# Patient Record
Sex: Male | Born: 1960 | Race: White | Hispanic: No | Marital: Married | Smoking: Former smoker
Health system: Southern US, Community
[De-identification: ages and names within clinical notes are randomized; demographics above are authoritative.]

## PROBLEM LIST (undated history)

## (undated) DIAGNOSIS — J449 Chronic obstructive pulmonary disease, unspecified: Secondary | ICD-10-CM

## (undated) DIAGNOSIS — F419 Anxiety disorder, unspecified: Secondary | ICD-10-CM

## (undated) DIAGNOSIS — I219 Acute myocardial infarction, unspecified: Secondary | ICD-10-CM

## (undated) DIAGNOSIS — I252 Old myocardial infarction: Secondary | ICD-10-CM

## (undated) DIAGNOSIS — C801 Malignant (primary) neoplasm, unspecified: Secondary | ICD-10-CM

## (undated) HISTORY — DX: Anxiety disorder, unspecified: F41.9

## (undated) HISTORY — PX: BACK SURGERY: SHX140

## (undated) HISTORY — DX: Malignant (primary) neoplasm, unspecified: C80.1

---

## 1998-11-20 ENCOUNTER — Emergency Department (HOSPITAL_COMMUNITY): Admission: EM | Admit: 1998-11-20 | Discharge: 1998-11-20 | Payer: Self-pay | Admitting: Emergency Medicine

## 2002-05-08 ENCOUNTER — Emergency Department (HOSPITAL_COMMUNITY): Admission: EM | Admit: 2002-05-08 | Discharge: 2002-05-08 | Payer: Self-pay | Admitting: *Deleted

## 2003-12-10 ENCOUNTER — Inpatient Hospital Stay (HOSPITAL_COMMUNITY): Admission: RE | Admit: 2003-12-10 | Discharge: 2003-12-15 | Payer: Self-pay | Admitting: Psychiatry

## 2003-12-10 ENCOUNTER — Ambulatory Visit: Payer: Self-pay | Admitting: Psychiatry

## 2004-03-03 ENCOUNTER — Inpatient Hospital Stay (HOSPITAL_COMMUNITY): Admission: RE | Admit: 2004-03-03 | Discharge: 2004-03-07 | Payer: Self-pay | Admitting: Psychiatry

## 2004-03-03 ENCOUNTER — Ambulatory Visit: Payer: Self-pay | Admitting: Psychiatry

## 2006-07-22 ENCOUNTER — Emergency Department (HOSPITAL_COMMUNITY): Admission: EM | Admit: 2006-07-22 | Discharge: 2006-07-22 | Payer: Self-pay | Admitting: Emergency Medicine

## 2006-11-03 ENCOUNTER — Emergency Department (HOSPITAL_COMMUNITY): Admission: EM | Admit: 2006-11-03 | Discharge: 2006-11-03 | Payer: Self-pay | Admitting: Emergency Medicine

## 2006-11-19 ENCOUNTER — Ambulatory Visit: Payer: Self-pay | Admitting: Family Medicine

## 2006-11-19 DIAGNOSIS — R519 Headache, unspecified: Secondary | ICD-10-CM | POA: Insufficient documentation

## 2006-11-19 DIAGNOSIS — F1021 Alcohol dependence, in remission: Secondary | ICD-10-CM | POA: Insufficient documentation

## 2006-11-19 DIAGNOSIS — E785 Hyperlipidemia, unspecified: Secondary | ICD-10-CM | POA: Insufficient documentation

## 2006-11-19 DIAGNOSIS — G8929 Other chronic pain: Secondary | ICD-10-CM | POA: Insufficient documentation

## 2006-11-19 DIAGNOSIS — M545 Low back pain, unspecified: Secondary | ICD-10-CM | POA: Insufficient documentation

## 2006-11-19 DIAGNOSIS — F32A Depression, unspecified: Secondary | ICD-10-CM | POA: Insufficient documentation

## 2006-11-19 DIAGNOSIS — R51 Headache: Secondary | ICD-10-CM | POA: Insufficient documentation

## 2006-11-19 DIAGNOSIS — M549 Dorsalgia, unspecified: Secondary | ICD-10-CM | POA: Insufficient documentation

## 2006-11-19 DIAGNOSIS — F329 Major depressive disorder, single episode, unspecified: Secondary | ICD-10-CM | POA: Insufficient documentation

## 2006-11-19 DIAGNOSIS — Z9189 Other specified personal risk factors, not elsewhere classified: Secondary | ICD-10-CM | POA: Insufficient documentation

## 2006-11-19 DIAGNOSIS — I252 Old myocardial infarction: Secondary | ICD-10-CM | POA: Insufficient documentation

## 2006-11-19 DIAGNOSIS — F419 Anxiety disorder, unspecified: Secondary | ICD-10-CM | POA: Insufficient documentation

## 2006-12-31 ENCOUNTER — Telehealth: Payer: Self-pay | Admitting: Family Medicine

## 2007-01-07 ENCOUNTER — Ambulatory Visit: Payer: Self-pay | Admitting: Family Medicine

## 2007-01-28 ENCOUNTER — Encounter: Payer: Self-pay | Admitting: Family Medicine

## 2007-02-15 ENCOUNTER — Ambulatory Visit: Payer: Self-pay | Admitting: Family Medicine

## 2007-02-24 ENCOUNTER — Telehealth: Payer: Self-pay | Admitting: Family Medicine

## 2007-09-30 ENCOUNTER — Ambulatory Visit: Payer: Self-pay | Admitting: Family Medicine

## 2007-10-21 ENCOUNTER — Telehealth: Payer: Self-pay | Admitting: Family Medicine

## 2007-11-01 ENCOUNTER — Telehealth: Payer: Self-pay | Admitting: Family Medicine

## 2007-11-15 ENCOUNTER — Emergency Department (HOSPITAL_COMMUNITY): Admission: EM | Admit: 2007-11-15 | Discharge: 2007-11-15 | Payer: Self-pay | Admitting: Emergency Medicine

## 2007-11-29 ENCOUNTER — Telehealth: Payer: Self-pay | Admitting: Family Medicine

## 2007-12-14 ENCOUNTER — Telehealth: Payer: Self-pay | Admitting: Family Medicine

## 2007-12-22 ENCOUNTER — Emergency Department (HOSPITAL_COMMUNITY): Admission: EM | Admit: 2007-12-22 | Discharge: 2007-12-22 | Payer: Self-pay | Admitting: Emergency Medicine

## 2007-12-26 ENCOUNTER — Emergency Department (HOSPITAL_COMMUNITY): Admission: EM | Admit: 2007-12-26 | Discharge: 2007-12-26 | Payer: Self-pay | Admitting: Emergency Medicine

## 2007-12-27 ENCOUNTER — Telehealth: Payer: Self-pay | Admitting: Family Medicine

## 2007-12-28 ENCOUNTER — Telehealth: Payer: Self-pay | Admitting: Family Medicine

## 2008-01-09 ENCOUNTER — Telehealth: Payer: Self-pay | Admitting: Family Medicine

## 2008-02-01 ENCOUNTER — Ambulatory Visit: Payer: Self-pay | Admitting: Cardiology

## 2008-02-02 ENCOUNTER — Ambulatory Visit: Payer: Self-pay

## 2008-02-02 ENCOUNTER — Encounter: Payer: Self-pay | Admitting: Cardiology

## 2008-02-07 ENCOUNTER — Ambulatory Visit: Payer: Self-pay | Admitting: Cardiology

## 2008-02-23 ENCOUNTER — Telehealth: Payer: Self-pay | Admitting: Family Medicine

## 2008-03-30 ENCOUNTER — Emergency Department (HOSPITAL_COMMUNITY): Admission: EM | Admit: 2008-03-30 | Discharge: 2008-03-30 | Payer: Self-pay | Admitting: Emergency Medicine

## 2008-05-31 ENCOUNTER — Emergency Department (HOSPITAL_COMMUNITY): Admission: EM | Admit: 2008-05-31 | Discharge: 2008-06-01 | Payer: Self-pay | Admitting: Emergency Medicine

## 2008-06-01 ENCOUNTER — Inpatient Hospital Stay (HOSPITAL_COMMUNITY): Admission: EM | Admit: 2008-06-01 | Discharge: 2008-06-13 | Payer: Self-pay | Admitting: Psychiatry

## 2008-06-01 ENCOUNTER — Ambulatory Visit: Payer: Self-pay | Admitting: Psychiatry

## 2008-09-05 ENCOUNTER — Encounter: Payer: Self-pay | Admitting: Family Medicine

## 2008-09-11 ENCOUNTER — Encounter (INDEPENDENT_AMBULATORY_CARE_PROVIDER_SITE_OTHER): Payer: Self-pay | Admitting: *Deleted

## 2008-09-14 ENCOUNTER — Ambulatory Visit: Payer: Self-pay | Admitting: Family Medicine

## 2009-07-01 ENCOUNTER — Emergency Department (HOSPITAL_COMMUNITY): Admission: EM | Admit: 2009-07-01 | Discharge: 2009-07-01 | Payer: Self-pay | Admitting: Emergency Medicine

## 2009-07-05 ENCOUNTER — Emergency Department (HOSPITAL_COMMUNITY): Admission: EM | Admit: 2009-07-05 | Discharge: 2009-07-05 | Payer: Self-pay | Admitting: Emergency Medicine

## 2009-08-25 ENCOUNTER — Emergency Department (HOSPITAL_COMMUNITY): Admission: EM | Admit: 2009-08-25 | Discharge: 2009-08-25 | Payer: Self-pay | Admitting: Emergency Medicine

## 2009-09-07 ENCOUNTER — Emergency Department (HOSPITAL_COMMUNITY): Admission: EM | Admit: 2009-09-07 | Discharge: 2009-09-09 | Payer: Self-pay | Admitting: Emergency Medicine

## 2009-09-09 ENCOUNTER — Inpatient Hospital Stay (HOSPITAL_COMMUNITY): Admission: AD | Admit: 2009-09-09 | Discharge: 2009-09-16 | Payer: Self-pay | Admitting: Psychiatry

## 2009-09-09 ENCOUNTER — Ambulatory Visit: Payer: Self-pay | Admitting: Psychiatry

## 2009-11-25 ENCOUNTER — Ambulatory Visit: Payer: Self-pay | Admitting: Cardiology

## 2009-11-25 ENCOUNTER — Inpatient Hospital Stay (HOSPITAL_COMMUNITY)
Admission: EM | Admit: 2009-11-25 | Discharge: 2009-11-30 | Payer: Self-pay | Source: Home / Self Care | Admitting: Emergency Medicine

## 2009-11-26 ENCOUNTER — Encounter (INDEPENDENT_AMBULATORY_CARE_PROVIDER_SITE_OTHER): Payer: Self-pay | Admitting: Internal Medicine

## 2010-02-26 ENCOUNTER — Emergency Department (HOSPITAL_COMMUNITY)
Admission: EM | Admit: 2010-02-26 | Discharge: 2010-02-26 | Payer: Self-pay | Source: Home / Self Care | Admitting: Emergency Medicine

## 2010-03-11 ENCOUNTER — Emergency Department (HOSPITAL_COMMUNITY)
Admission: EM | Admit: 2010-03-11 | Discharge: 2010-03-11 | Payer: Self-pay | Source: Home / Self Care | Admitting: Emergency Medicine

## 2010-04-08 ENCOUNTER — Inpatient Hospital Stay (INDEPENDENT_AMBULATORY_CARE_PROVIDER_SITE_OTHER)
Admission: RE | Admit: 2010-04-08 | Discharge: 2010-04-08 | Disposition: A | Discharge status: Home / Self Care | Payer: Self-pay | Source: Ambulatory Visit | Attending: Family Medicine | Admitting: Family Medicine

## 2010-04-08 ENCOUNTER — Ambulatory Visit (INDEPENDENT_AMBULATORY_CARE_PROVIDER_SITE_OTHER): Payer: Self-pay

## 2010-04-08 DIAGNOSIS — M549 Dorsalgia, unspecified: Secondary | ICD-10-CM

## 2010-04-21 ENCOUNTER — Emergency Department (HOSPITAL_COMMUNITY)
Admission: EM | Admit: 2010-04-21 | Discharge: 2010-04-21 | Disposition: A | Discharge status: Home / Self Care | Payer: Self-pay | Attending: Emergency Medicine | Admitting: Emergency Medicine

## 2010-04-21 DIAGNOSIS — F1110 Opioid abuse, uncomplicated: Secondary | ICD-10-CM | POA: Insufficient documentation

## 2010-04-21 DIAGNOSIS — I252 Old myocardial infarction: Secondary | ICD-10-CM | POA: Insufficient documentation

## 2010-04-21 DIAGNOSIS — F411 Generalized anxiety disorder: Secondary | ICD-10-CM | POA: Insufficient documentation

## 2010-05-01 LAB — BASIC METABOLIC PANEL
BUN: 17 mg/dL (ref 6–23)
CO2: 22 mEq/L (ref 19–32)
CO2: 24 mEq/L (ref 19–32)
Calcium: 9.2 mg/dL (ref 8.4–10.5)
Chloride: 103 mEq/L (ref 96–112)
Chloride: 104 mEq/L (ref 96–112)
Chloride: 107 mEq/L (ref 96–112)
Creatinine, Ser: 1.18 mg/dL (ref 0.4–1.5)
GFR calc Af Amer: >60 mL/min (ref >60)
GFR calc Af Amer: >60 mL/min (ref >60)
GFR calc non Af Amer: >60 mL/min (ref >60)
Potassium: 3.6 mEq/L (ref 3.5–5.1)
Potassium: 3.8 mEq/L (ref 3.5–5.1)
Sodium: 139 mEq/L (ref 135–145)

## 2010-05-01 LAB — CBC
HCT: 38.8 % — ABNORMAL LOW (ref 39.0–52.0)
HCT: 41.6 % (ref 39.0–52.0)
MCH: 32.1 pg (ref 26.0–34.0)
MCH: 32.1 pg (ref 26.0–34.0)
MCHC: 34.5 g/dL (ref 30.0–36.0)
MCV: 92.6 fL (ref 78.0–100.0)
MCV: 93 fL (ref 78.0–100.0)
MCV: 93.6 fL (ref 78.0–100.0)
Platelets: 176 10*3/uL (ref 150–400)
Platelets: 209 10*3/uL (ref 150–400)
RBC: 4.44 MIL/uL (ref 4.22–5.81)
RDW: 12.2 % (ref 11.5–15.5)
RDW: 12.3 % (ref 11.5–15.5)
WBC: 14.6 10*3/uL — ABNORMAL HIGH (ref 4.0–10.5)

## 2010-05-01 LAB — CARDIAC PANEL(CRET KIN+CKTOT+MB+TROPI)
CK, MB: 0.7 ng/mL (ref 0.3–4.0)
Relative Index: INVALID (ref 0.0–2.5)
Relative Index: INVALID (ref 0.0–2.5)
Total CK: 58 U/L (ref 7–232)
Total CK: 68 U/L (ref 7–232)
Troponin I: 0.01 ng/mL (ref 0.00–0.06)
Troponin I: <0.01 ng/mL (ref 0.00–0.06)

## 2010-05-01 LAB — COMPREHENSIVE METABOLIC PANEL
Alkaline Phosphatase: 67 U/L (ref 39–117)
BUN: 15 mg/dL (ref 6–23)
Glucose, Bld: 213 mg/dL — ABNORMAL HIGH (ref 70–99)
Potassium: 4 mEq/L (ref 3.5–5.1)
Total Protein: 6.6 g/dL (ref 6.0–8.3)

## 2010-05-01 LAB — DIFFERENTIAL
Basophils Absolute: 0 10*3/uL (ref 0.0–0.1)
Basophils Absolute: 0 10*3/uL (ref 0.0–0.1)
Basophils Relative: 0 % (ref 0–1)
Basophils Relative: 0 % (ref 0–1)
Eosinophils Absolute: 0 10*3/uL (ref 0.0–0.7)
Eosinophils Relative: 0 % (ref 0–5)
Monocytes Relative: 1 % — ABNORMAL LOW (ref 3–12)
Neutro Abs: 13.4 10*3/uL — ABNORMAL HIGH (ref 1.7–7.7)
Neutrophils Relative %: 96 % — ABNORMAL HIGH (ref 43–77)

## 2010-05-01 LAB — RAPID URINE DRUG SCREEN, HOSP PERFORMED
Opiates: POSITIVE — AB
Tetrahydrocannabinol: POSITIVE — AB

## 2010-05-01 LAB — HEMOGLOBIN A1C: Mean Plasma Glucose: 114 mg/dL (ref <117)

## 2010-05-03 LAB — DIFFERENTIAL
Basophils Absolute: 0 10*3/uL (ref 0.0–0.1)
Basophils Relative: 0 % (ref 0–1)
Eosinophils Absolute: 0 10*3/uL (ref 0.0–0.7)
Eosinophils Relative: 0 % (ref 0–5)
Monocytes Absolute: 0.6 10*3/uL (ref 0.1–1.0)
Monocytes Relative: 5 % (ref 3–12)

## 2010-05-03 LAB — RAPID URINE DRUG SCREEN, HOSP PERFORMED
Benzodiazepines: NOT DETECTED
Cocaine: NOT DETECTED
Opiates: NOT DETECTED
Tetrahydrocannabinol: POSITIVE — AB

## 2010-05-03 LAB — BASIC METABOLIC PANEL
BUN: 28 mg/dL — ABNORMAL HIGH (ref 6–23)
CO2: 30 mEq/L (ref 19–32)
GFR calc non Af Amer: >60 mL/min (ref >60)
Glucose, Bld: 101 mg/dL — ABNORMAL HIGH (ref 70–99)
Potassium: 3.4 mEq/L — ABNORMAL LOW (ref 3.5–5.1)

## 2010-05-03 LAB — CBC
HCT: 37.6 % — ABNORMAL LOW (ref 39.0–52.0)
Hemoglobin: 13.2 g/dL (ref 13.0–17.0)
MCH: 32.6 pg (ref 26.0–34.0)
MCHC: 35.1 g/dL (ref 30.0–36.0)
MCV: 93.1 fL (ref 78.0–100.0)
RDW: 12.8 % (ref 11.5–15.5)

## 2010-05-03 LAB — TRICYCLICS SCREEN, URINE: TCA Scrn: NOT DETECTED

## 2010-05-28 LAB — DIFFERENTIAL
Eosinophils Relative: 0 % (ref 0–5)
Lymphocytes Relative: 8 % — ABNORMAL LOW (ref 12–46)
Lymphs Abs: 0.8 10*3/uL (ref 0.7–4.0)
Monocytes Relative: 3 % (ref 3–12)
Neutrophils Relative %: 89 % — ABNORMAL HIGH (ref 43–77)

## 2010-05-28 LAB — CBC
MCHC: 35.3 g/dL (ref 30.0–36.0)
MCV: 94.6 fL (ref 78.0–100.0)
RBC: 4.42 MIL/uL (ref 4.22–5.81)
RDW: 13.9 % (ref 11.5–15.5)

## 2010-05-28 LAB — COMPREHENSIVE METABOLIC PANEL
AST: 26 U/L (ref 0–37)
CO2: 22 mEq/L (ref 19–32)
Calcium: 8.8 mg/dL (ref 8.4–10.5)
Creatinine, Ser: 0.98 mg/dL (ref 0.4–1.5)
GFR calc Af Amer: >60 mL/min (ref >60)
GFR calc non Af Amer: >60 mL/min (ref >60)
Total Protein: 6.5 g/dL (ref 6.0–8.3)

## 2010-05-28 LAB — RAPID URINE DRUG SCREEN, HOSP PERFORMED
Amphetamines: NOT DETECTED
Barbiturates: NOT DETECTED
Benzodiazepines: NOT DETECTED
Opiates: NOT DETECTED
Tetrahydrocannabinol: POSITIVE — AB

## 2010-06-09 ENCOUNTER — Inpatient Hospital Stay (INDEPENDENT_AMBULATORY_CARE_PROVIDER_SITE_OTHER)
Admission: RE | Admit: 2010-06-09 | Discharge: 2010-06-09 | Disposition: A | Discharge status: Home / Self Care | Payer: Self-pay | Source: Ambulatory Visit

## 2010-06-09 ENCOUNTER — Ambulatory Visit (INDEPENDENT_AMBULATORY_CARE_PROVIDER_SITE_OTHER): Payer: Self-pay

## 2010-06-09 DIAGNOSIS — S4350XA Sprain of unspecified acromioclavicular joint, initial encounter: Secondary | ICD-10-CM

## 2010-06-19 ENCOUNTER — Emergency Department (HOSPITAL_COMMUNITY)
Admission: EM | Admit: 2010-06-19 | Discharge: 2010-06-19 | Disposition: A | Discharge status: Other Acute Inpatient Hospital | Payer: Self-pay | Attending: Emergency Medicine | Admitting: Emergency Medicine

## 2010-06-19 ENCOUNTER — Inpatient Hospital Stay (HOSPITAL_COMMUNITY)
Admission: RE | Admit: 2010-06-19 | Discharge: 2010-06-23 | DRG: 897 | Disposition: A | Discharge status: Home / Self Care | Payer: PRIVATE HEALTH INSURANCE | Attending: Psychiatry | Admitting: Psychiatry

## 2010-06-19 DIAGNOSIS — G8929 Other chronic pain: Secondary | ICD-10-CM

## 2010-06-19 DIAGNOSIS — Z560 Unemployment, unspecified: Secondary | ICD-10-CM

## 2010-06-19 DIAGNOSIS — J4489 Other specified chronic obstructive pulmonary disease: Secondary | ICD-10-CM

## 2010-06-19 DIAGNOSIS — F17200 Nicotine dependence, unspecified, uncomplicated: Secondary | ICD-10-CM

## 2010-06-19 DIAGNOSIS — F1920 Other psychoactive substance dependence, uncomplicated: Principal | ICD-10-CM

## 2010-06-19 DIAGNOSIS — F319 Bipolar disorder, unspecified: Secondary | ICD-10-CM | POA: Insufficient documentation

## 2010-06-19 DIAGNOSIS — I252 Old myocardial infarction: Secondary | ICD-10-CM | POA: Insufficient documentation

## 2010-06-19 DIAGNOSIS — I2510 Atherosclerotic heart disease of native coronary artery without angina pectoris: Secondary | ICD-10-CM

## 2010-06-19 DIAGNOSIS — F39 Unspecified mood [affective] disorder: Secondary | ICD-10-CM

## 2010-06-19 DIAGNOSIS — J449 Chronic obstructive pulmonary disease, unspecified: Secondary | ICD-10-CM

## 2010-06-19 DIAGNOSIS — F1010 Alcohol abuse, uncomplicated: Secondary | ICD-10-CM

## 2010-06-19 DIAGNOSIS — M549 Dorsalgia, unspecified: Secondary | ICD-10-CM

## 2010-06-19 DIAGNOSIS — R45851 Suicidal ideations: Secondary | ICD-10-CM

## 2010-06-19 DIAGNOSIS — Z6379 Other stressful life events affecting family and household: Secondary | ICD-10-CM

## 2010-06-19 LAB — COMPREHENSIVE METABOLIC PANEL
Albumin: 4.5 g/dL (ref 3.5–5.2)
Alkaline Phosphatase: 64 U/L (ref 39–117)
BUN: 23 mg/dL (ref 6–23)
CO2: 26 mEq/L (ref 19–32)
Chloride: 99 mEq/L (ref 96–112)
Glucose, Bld: 86 mg/dL (ref 70–99)
Potassium: 4.3 mEq/L (ref 3.5–5.1)
Total Bilirubin: 0.5 mg/dL (ref 0.3–1.2)

## 2010-06-19 LAB — CBC
HCT: 42.6 % (ref 39.0–52.0)
Hemoglobin: 14.5 g/dL (ref 13.0–17.0)
MCV: 92.8 fL (ref 78.0–100.0)
RBC: 4.59 MIL/uL (ref 4.22–5.81)
WBC: 12.7 10*3/uL — ABNORMAL HIGH (ref 4.0–10.5)

## 2010-06-19 LAB — DIFFERENTIAL
Lymphocytes Relative: 19 % (ref 12–46)
Lymphs Abs: 2.4 10*3/uL (ref 0.7–4.0)
Neutrophils Relative %: 74 % (ref 43–77)

## 2010-06-19 LAB — RAPID URINE DRUG SCREEN, HOSP PERFORMED: Tetrahydrocannabinol: POSITIVE — AB

## 2010-06-20 DIAGNOSIS — F1920 Other psychoactive substance dependence, uncomplicated: Secondary | ICD-10-CM

## 2010-06-23 NOTE — H&P (Signed)
NAMEBABE, KOLENOVIC                ACCOUNT NO.:  [id number]  MEDICAL RECORD NO.:  [id number]           PATIENT TYPE:  I  LOCATION:  0504                          FACILITY:  BH  PHYSICIAN:  Anselm Jungling, MD  DATE OF BIRTH:  January 27, 1961  DATE OF ADMISSION:  06/19/2010 DATE OF DISCHARGE:                      PSYCHIATRIC ADMISSION ASSESSMENT   IDENTIFYING INFORMATION:  A 50 year old male.  This is a voluntary admission.  HISTORY OF PRESENT ILLNESS:  This is one of several [facility] admissions for Thomas Dodson, who was last in our unit in August 2011.  He presents with complaints of polysubstance dependence and has recently been using various opiates heavily.  He is requesting detox.  He also endorses depressed mood with some passive suicidal thoughts, still grieving the loss of his wife, who died in Jan 20, 2009.  He feels he cannot get over it.  He is previously being treated for PTSD related to her overdose death and was taking Celexa and Risperdal, but stopped those several months ago. Denying a plan for suicide today.  PAST PSYCHIATRIC HISTORY:  Last behavioral health admission in August 2011.  He has 5 [facility] admissions since 2004/01/21.  Diagnosed with PTSD in 01/21/2004, related to an overdose by his wife.  He has a history of alcohol abuse and lost his job because of alcohol abuse and drinking on the job within the past year.  He has a history of a distant admission to [facility] many years ago.  Previous drug trials include Celexa, Zyprexa, Risperdal and trazodone for agitation.  He has a history of one previous suicide attempt.  SOCIAL HISTORY:  Previously married twice.  He has two grown children. He is now divorced.  He used to work for a [employer] and now has been unemployed in the past year because of substance abuse problems.  He denies any current legal charges.  He is living in his own apartment.  FAMILY HISTORY:  Mother, father and sister all with substance  abuse issues.  MEDICAL HISTORY:  Primary care physician is Dr. Delrae Alfred at [facility]. Medical problems are chronic back pain and COPD.  PAST MEDICAL HISTORY: 1. Coronary artery disease with a questionable history of an MI. 2. He has a history of spinal fracture with surgery and rod placement     in January 20, 1993.  CURRENT MEDICATIONS:  None.  ALLERGIES: 1. PENICILLIN. 2. CECLOR. 3. MORPHINE.  Physical exam was done in the emergency room and noted in the record. This is a short stature slight built Caucasian male in no acute physical distress.  Admitting vital signs temperature 98.2, pulse 74, respirations 18, blood pressure 104/71.  His urine drug screen was positive for opiates, cocaine, benzodiazepines and cannabis.  CBC; WBC 12.7, hemoglobin 14.5, hematocrit 42.6, platelets 281,000.  Chemistry normal.  BUN 23, creatinine 1.15.  Normal liver enzymes.  Alcohol screen negative.  MENTAL STATUS EXAM:  Fully alert male, pleasant, cooperative, anxious in full contact with reality.  Thoughts and speech normally organized. Acknowledges substance abuse and requests help with that.  Cognition is intact.  Axis I:  Polysubstance dependence. Axis II:  Deferred. Axis III:  Chronic back pain. Axis IV:  Deferred. Axis V:  Global Assessment of Functioning current 44, past year not known.  PLAN:  Voluntarily admit him with a goal of a safe detox in 5 days.  We started him on both clonidine and a Librium protocol and he is working with our case Production designer, theatre/television/film on goals and possible follow-up plans.     Margaret A. Lorin Picket, N.P.   ______________________________ Anselm Jungling, MD    MAS/MEDQ  D:  06/22/2010  T:  06/22/2010  Job:  [id number]  Electronically Signed by Kari Baars N.P. on 06/23/2010 10:14:55 AM Electronically Signed by Geralyn Flash MD on 06/23/2010 12:12:23 PM

## 2010-06-24 NOTE — Discharge Summary (Signed)
NAME:  Thomas Dodson, Thomas Dodson                ACCOUNT NO.:  [id number]  MEDICAL RECORD NO.:  [id number]           PATIENT TYPE:  I  LOCATION:  0504                          FACILITY:  BH  PHYSICIAN:  Anselm Jungling, MD  DATE OF BIRTH:  1960/05/30  DATE OF ADMISSION:  06/19/2010 DATE OF DISCHARGE:  06/23/2010                              DISCHARGE SUMMARY   IDENTIFYING DATA AND REASON FOR ADMISSION:  This was an inpatient psychiatric admission for Thomas Dodson, a 50 year old single male, who was admitted for treatment of polysubstance dependence.  Please refer to the admission note for further details pertaining to the symptoms, circumstances and history that led to his hospitalization.  He was given an initial Axis I diagnosis of polysubstance dependence.  MEDICAL AND LABORATORY:  The patient was medically and physically assessed by the psychiatric nurse practitioner.  He was in generally good health, but had a history of asthma, and chronic back pain.  These were addressed with Spiriva and Lidoderm respectively.  There were no other significant medical issues.  HOSPITAL COURSE:  The patient was admitted to the adult inpatient psychiatric service.  He presented as a well-nourished, normally- developed adult male, who was urine drug screen positive for benzodiazepines, cocaine, opiates, and marijuana.  He was placed on Librium withdrawal protocol.  His detoxification was uneventful.  He participated in therapeutic groups and activities geared towards 12-step recovery.  There were some incidents around smoking cigarettes on the unit, a clear rule infraction, but he denied any guilt or wrong doing in this.  He was treated with a psychotropic regimen that included low-dose Risperdal 0.5 mg b.i.d.  However, at the time of discharge his departing dosage was increased to 1 mg b.i.d.  Also, the patient was restarted on Prozac, at 20 mg daily.  He reported that Prozac had been an  effective medication in the past.  Trazodone was utilized for sleep.  The patient was appropriate for discharge on the fifth hospital day, having completed his detoxification protocol uneventfully.  He was absent suicidal ideation.  The suicide risk assessment was completed on discharge and he was rated as at minimal risk.  He agreed to the following aftercare plan.  AFTERCARE:  The patient was to follow-up with the [facility] in [place] for outpatient treatment, time to be arranged at the time of this dictation.  DISCHARGE MEDICATIONS: 1. Risperdal 1 mg b.i.d. 2. Prozac 20 mg daily. 3. Trazodone 100 mg nightly. 4. Spiriva 18 mcg daily. 5. Lidoderm patch 5% to back daily.  DISCHARGE DIAGNOSES:  Axis I:  Polysubstance dependence.  Mood disorder, not otherwise specified. Axis II:  Deferred. Axis III:  History of asthma, chronic back pain. Axis IV: Stressors severe. Axis V:  Global Assessment of Functioning on discharge 50.     Anselm Jungling, MD     SPB/MEDQ  D:  06/23/2010  T:  06/24/2010  Job:  [id number]  Electronically Signed by Geralyn Flash MD on 06/24/2010 10:51:34 AM

## 2010-07-01 NOTE — Assessment & Plan Note (Signed)
[facility]                            CARDIOLOGY OFFICE NOTE   BUDDY, LOEFFELHOLZ                       MRN:          [id number]  DATE:02/07/2008                            DOB:          Oct 13, 1960    Mr. Bachus is here for followup.  I saw him in the office in consultation  on February 01, 2008.  He had some palpitations.  He had some slight  shortness of breath.  We did proceed with a 2D echo.  This was done on  February 02, 2008.  He has normal LV function.  There is question of  slight posterior hypokinesis.  His ejection fraction is 55%.  This area  of slight posterior hypokinesis may correlate with the data from 2004.  He had a Cardiolite then that raised the question that he may have had a  small inferior MI in the past.  At that time, he did not undergo cardiac  catheterization and was lost to cardiology follow up until my recent  evaluation on February 01, 2008.   The patient also wore a Holter.  He had scattered PVCs.   Today, he is doing well.  He is going about full activities.  He has not  had significant symptoms.   PAST MEDICAL HISTORY:   ALLERGIES:  PENICILLIN and CECLOR.   MEDICATIONS:  See the flow sheet.   REVIEW OF SYSTEMS:  He is not having any GI or GU symptoms.  He has no  headache, fevers, chills, or skin rashes.  His review of systems  otherwise is negative.   PHYSICAL EXAMINATION:  VITAL SIGNS:  Blood pressure 126/82 with a pulse  of 75.  GENERAL:  The patient is oriented to person, time, and place.  Affect is  normal.  HEENT:  No xanthelasma.  He has normal extraocular motion.  NECK:  There are no carotid bruits.  There is no jugular venous  distention.  CARDIAC:  S1 with an S2.  There are no clicks or significant murmurs.   Mr. Griesinger is stable.  His palpitations are from PACs.  I explained this  to him and he seems comfortable with it.   Problems include,  1. Palpitations, benign.  2. Normal overall systolic  left ventricular function.  3. Mild posterior hypokinesis by echo.  With this finding in mind, I      will follow up with the      patient to discuss the possibility of further exercise testing      depending on how he is feeling.  Clearly, he needs to follow      through with risk factor modification and stop smoking.     Luis Abed, MD, [facility]  Electronically Signed    JDK/MedQ  DD: 02/07/2008  DT: 02/07/2008  Job #: [id number]   cc:   Jeannett Senior A. Clent Ridges, MD

## 2010-07-01 NOTE — H&P (Signed)
Thomas Dodson, Thomas Dodson NO.:  [id number]   MEDICAL RECORD NO.:  [id number]          PATIENT TYPE:  INP   LOCATION:  0406                          FACILITY:  BH   PHYSICIAN:  Anselm Jungling, MD  DATE OF BIRTH:  [birth year]   DATE OF ADMISSION:  06/01/2008  DATE OF DISCHARGE:                       PSYCHIATRIC ADMISSION ASSESSMENT   IDENTIFICATION:  This is a 50 year old male.  This is a voluntary  admission.   HISTORY OF PRESENT ILLNESS:  Third [facility] admission for this pleasant 39-  year-old who presents requesting detox from alcohol, had been previously  abstinent from alcohol about 10 years and relapsed about 5 months ago.  He says his drinking rapidly got out of control and has escalated to the  point where he is drinking large amounts of liquor every day including  moonshine.  He had made a couple of efforts to stop and actually stopped  for about 48 hours, could not stand the tremulousness and was getting a  little confused and then drank a large amount to handle his symptoms.  He realizes he cannot stop on his own and is asking for help.  He has a  history of PTSD after his wife's suicide attempt by overdose many years  ago.  Cites currently that he thinks the relapse trigger was marital  issues now, having lost his second marriage to his alcohol abuse.  He  feels that he needs longer residential treatment to get a leg up on some  abstinence and is requesting to go to a program after discharge here.  He denies suicidal thoughts and denies a prior history of suicide  attempts.  He reports compliance with his medications.   PAST PSYCHIATRIC HISTORY:  Currently followed as an outpatient by Ellis Savage, PA, at [facility].  Third [facility] admission with  most recent one in 2006.  He has a history of PTSD and has a history of  previous trials on Abilify, Celexa, trazodone and Gabitril.  Also using  marijuana intermittently.   SOCIAL HISTORY:   Divorced male, currently single.  Currently unemployed.  Has a brother in town that is supportive.  He has no legal charges.   FAMILY HISTORY:  Denies a family history of mental illness or substance  abuse.   MEDICAL HISTORY:  Primary care Kameria Canizares is Dr. Liana Gerold, MD, and he  has seen Dr. Jerral Bonito for cardiology evaluation in the past.  Medical  problems include chronic back pain.  He has a history of surgery.   CURRENT MEDICATIONS:  1. Risperdal 1 mg q.a.m. 2 mg at bedtime.  2. Celexa 60 mg p.o. q.a.m.  3. Trazodone 150-300 mg nightly p.r.n. insomnia.   DRUG ALLERGIES:  PENICILLIN and CECLOR   PHYSICAL EXAMINATION:  Physical exam was done in the emergency room.  GENERAL APPEARANCE:  This is a slight built male, 5 feet 8 inches tall,  135 pounds.  VITAL SIGNS:  On admission temperature 97.2, pulse 80, respirations 20,  blood pressure 130/90.  He is in mild distress today.  Appears to have a CIWA approximately 12  with a fine motor tremor, flushed face and appears to be in full detox.  Diagnostic studies were remarkable for urine drug screen positive for  marijuana.  Full physical exam is noted in the record.  No signs of  motor ataxia.  Does have some tremor, one beat of nystagmus in each  direction.  Romberg without findings.   MENTAL STATUS EXAM:  Fully alert, pleasant, cooperative, anxious,  appears in full detox.  Speech is normal, giving a coherent history.  Mood anxious.  Thought process logical and coherent.  Insight good,  memory intact.  No evidence of delirium or confusion.  No homicidal  thoughts.  No suicidal thoughts.  No evidence of psychosis.  Cognition  fully preserved.   Axis I:  1. Alcohol abuse and dependence.  2. Post-traumatic stress  disorder by history.  Axis II:  Deferred.  Axis III:  Chronic back pain, not otherwise specified.  Axis IV:  Issues with recent relationship breakup.  Axis V:  Current 38, past year 68 estimated.   PLAN:  Voluntarily  admit him with a goal of a safe detox in 5 days.  We  are going to resume his usual medications including his Risperdal and  did give him Librium protocol with 50 mg now and then he will have  additional 25 mg q.i.d. today.  Other supportive medications for  withdrawal.  He is in our Dual Diagnosis Program.      Young Berry. Lorin Picket, N.P.      Anselm Jungling, MD  Electronically Signed    MAS/MEDQ  D:  06/01/2008  T:  06/01/2008  Job:  [id number]

## 2010-07-01 NOTE — Assessment & Plan Note (Signed)
[facility]                            CARDIOLOGY OFFICE NOTE   TABITHA, BALLENGEE                       MRN:          [id number]  DATE:02/01/2008                            DOB:          15-Feb-1961    Mr. Dacosta is here for the evaluation of tachy palpitations and some chest  discomfort and shortness of breath.  He had been seen by Dr. Daleen Squibb in our  practice and was last seen in 2004.  At that time, his cardiac status  was stable.  He had had a Cardiolite scan which raised the question that  he may have had a small inferior MI in the past.  He did not undergo  cardiac catheterization and was lost to Cardiology followup over these  years.  He works for [employer] now.  He is concerned about his symptoms and he  is here for reevaluation.   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  This is vigorous work.  He has had some mild  dizziness.  There has been no syncope or presyncope.   PAST MEDICAL HISTORY:   ALLERGIES:  PENICILLIN and CECLOR.   MEDICATIONS:  1. Celexa 60.  2. Trazodone 150.  3. Risperdal t.i.d.   OTHER MEDICAL PROBLEMS:  See the list below.   SOCIAL HISTORY:  The patient does smoke 2 packs per day.  He works as a  Designer, television/film set for [employer].  He is married with two children.   FAMILY HISTORY:  There is no strong family history of coronary disease.   REVIEW OF SYSTEMS:  He is not having any headache or eye problems at  this time.  He says he does have some mild shortness of breath.  He has  no GI or GU symptoms.  There are no fevers or chills.  Otherwise his  review of systems is negative.   PHYSICAL EXAMINATION:  VITAL SIGNS:  Blood pressure is 106/74.  Pulse is  67.  GENERAL:  The patient is oriented to person, time, and place.  Affect is  normal.  HEENT:  No xanthelasma.  He has normal extraocular motion.  He does  smell of cigarette smoking.  LUNGS:  Few end expiratory wheezes.  CARDIAC:  S1 with  an S2.  There are no clicks or significant murmurs.  ABDOMEN:  Soft.  He has no peripheral edema.  He does have a few tattoos  on his back.   EKG reveals normal sinus rhythm.  He has increased voltage and RSR prime  in V1.   PROBLEMS:  1. History of anxiety and depression and he is on appropriate      medications for this.  2. History of allergies to PENICILLIN and CECLOR.  3. History of low-back pain.  4. History of alcohol abuse.  5. Hyperlipidemia.  6. History of headaches.  7. History of headaches.  8. History of a Myoview scan or Cardiolite scan correction done in      2004, raising the question of a small inferior myocardial  infarction in the past.  He may well need further assessment of      this issue.  We have not actually documented an myocardial      infarction.  9. Current palpitations.  He notes increased heart rate when he is      working.  He also has some shortness of breath and some vague chest      discomfort.  We will have him wear a 24-hour monitor to get a      better handle on his heart rate.  We will also have a 2-D echo.  I      will then see him back to reassess his overall status and decide on      how to proceed.     Luis Abed, MD, [facility]  Electronically Signed    JDK/MedQ  DD: 02/01/2008  DT: 02/02/2008  Job #: [id number]   cc:   Jeannett Senior A. Clent Ridges, MD

## 2010-07-04 NOTE — Discharge Summary (Signed)
NAME:  Thomas Dodson, SUGDEN NO.:  [id number]   MEDICAL RECORD NO.:  [id number]          PATIENT TYPE:  IPS   LOCATION:  0501                          FACILITY:  BH   PHYSICIAN:  Geoffery Lyons, M.D.      DATE OF BIRTH:  [birth year]   DATE OF ADMISSION:  12/10/2003  DATE OF DISCHARGE:  12/15/2003                                 DISCHARGE SUMMARY   CHIEF COMPLAINT AND PRESENT ILLNESS:  This was the first admission to [facility] for this 50 year old white male, married, voluntarily  admitted.  Presented as a walk-in.  Was complaining of increased anxiety,  agitated thoughts, constant flashbacks in the intensive care unit after her  suicide attempt.  His wife, of 10 years, overdosed on medication four weeks  prior to this admission and was in the ICU.  He is unable to remove the  image of her from his mind.  Apparently, she had a latex reaction and had a  swollen face.  Has not been able to sleep more than 1-2 hours at a time.  Agitated thoughts, constant worrying, unable to sleep properly or eat  properly.  Last 12 pounds.  Tearful, fatigue, hopeless, felt he could not go  on.  Unable to concentrate.   PAST PSYCHIATRIC HISTORY:  No ongoing treatment.  First time at [facility].  Distant history of being admitted to [facility].   ALCOHOL/DRUG HISTORY:  Prior history of alcohol abuse up until seven years  ago.  Since then, abstinent, no relapse.  No other substances.   PAST MEDICAL HISTORY:  Coronary artery disease, status post myocardial  infarction, fractured spine, L3 through L5, fusion in 1994 with rod  placement.   MEDICATIONS:  Lipitor 5 mg daily, aspirin 81 mg daily.   PHYSICAL EXAMINATION:  Performed and failed to show any acute findings.   LABORATORY DATA:  CBC with hemoglobin 12.7, hematocrit 37.2; repeated  hemoglobin 13.4, hematocrit 38.9.  Blood chemistry within normal limits.  Liver enzymes within normal limits.  Triglycerides 257.   Cholesterol 120.  CK 48, CK-MB 0.7.  TSH 1.380.  Drug screen negative for substances of abuse.   MENTAL STATUS EXAM:  Fully alert male, pleasant, cooperative, very anxious,  bags under his eyes, tearful throughout the interview.  Appears fatigued.  Eye contact is broken.  Speech was halting, some hesitancy, some response  lag but normal tone and pace.  Mood was depressed, anxious, somewhat  agitated.  Thought processes were agitated with constant flashbacks of  seeing his wife in the ICU.  Suicide ruminations.  Cognition preserved.   ADMISSION DIAGNOSES:   AXIS I:  1.  Major depression.  2.  Post-traumatic stress disorder.   AXIS II:  No diagnosis.   AXIS III:  1.  Coronary artery disease.  2.  Status post myocardial infarction.   AXIS IV:  Moderate to severe.   AXIS V:  Global Assessment of Functioning upon admission 25; highest Global  Assessment of Functioning in the last year 70.   HOSPITAL COURSE:  He was admitted and started  in individual and group  psychotherapy.  He was given some trazodone for sleep.  He was given some  Seroquel as needed.  This was switched to Celexa.  He was started on Celexa  10 mg per day.  Xanax was increased to 20 mg per day.  Continued to have  nightmares.  He was placed on Gabitril 4 mg at night.  Gabitril was  increased to 2 mg twice a day and 4 mg at bedtime.  Initially, he did  endorse a very difficult time after wife tried to commit suicide.  Still  sees her in flashbacks and nightmares.  Cannot concentrate.  Says when he  tries to concentrate, he sees her face in the ICU, tongue protruding.  A lot  of guilt feelings because he could not detect that she was getting depressed  or that she had overdosed whenever she did.  Could not get it out of his  mind.  As we adjusted his medications, he started sleeping.  Still anxiety  during the day.  Intrusive thoughts of his wife being in the ICU unit.  On  October 28th, he continued to do better.   Has seen a decrease in the  nightmares.  More relaxed.  Able to open up and talk about the trauma.  On  October 29th, he was in full contact with reality.  Markedly improved.  His  affect was bright.  His mood was euthymic.  His thought processes were  logical, coherent and relevant.  He was excited about the way he was  feeling.  Endorsed no suicidal or homicidal ideation.  Willing to pursue  further work on self.   DISCHARGE DIAGNOSES:   AXIS I:  1.  Post-traumatic stress disorder.  2.  Depressive disorder not otherwise specified.   AXIS II:  No diagnosis.   AXIS III:  1.  Status post myocardial infarction.  2.  Coronary artery disease.   AXIS IV:  Moderate.   AXIS V:  Global Assessment of Functioning upon discharge 60-65.   DISCHARGE MEDICATIONS:  1.  Zyprexa Zydis 5 mg, 1 twice a day as needed and 1 at bedtime.  2.  Lipitor 5 mg daily.  3.  Celexa 20 mg per day.  4.  Gabitril 2 mg twice a day and 4 mg at night.  5.  Trazodone 50 mg at night.  6.  Midrin 2 at beginning of headache.   FOLLOW UP:  Dr. Betti Cruz.     Farrel Gordon   IL/MEDQ  D:  01/08/2004  T:  01/08/2004  Job:  [id number]

## 2010-07-04 NOTE — Discharge Summary (Signed)
NAMELEVIN, PULLING NO.:  [id number]   MEDICAL RECORD NO.:  [id number]          PATIENT TYPE:  IPS   LOCATION:  0504                          FACILITY:  [facility]   PHYSICIAN:  Geoffery Lyons, M.D.      DATE OF BIRTH:  1960/07/04   DATE OF ADMISSION:  06/01/2008  DATE OF DISCHARGE:  06/13/2008                               DISCHARGE SUMMARY   CHIEF COMPLAINT AND PRESENT ILLNESS:  This was the third admission to  [facility] for this 50 year old male who presented  requesting detox from alcohol.  He had been abstinent for 10 year.  Relapsed 5 months prior to this admission.  The drinking escalated and  is completely out of control, drinking large amounts of liquor every day  including moonshine.  However, efforts to stop, can do it for 48  hours, then becomes shaky, confused, then has to drink to get the  symptoms under control.  Cannot stop on his own.  History of PTSD after  his wife's suicide by overdose.  Lost a second marriage to his alcohol  abuse.  Wanting a long-term residential program.   PAST PSYCHIATRIC HISTORY:  Sees Ellis Savage at [facility].  Third  time at [facility], most recently in 2006.  History of PTSD.  Has  been on Abilify, Celexa, trazodone, Gabitril,   ALCOHOL AND DRUG HISTORY:  Persistent use of alcohol after long-term  sobriety, unable to stop, with withdrawal.  Using marijuana  intermittently.   MEDICAL HISTORY:  Chronic back pain.   MEDICATIONS:  1. Risperdal 1 mg in the morning, 2 at night.  2. Celexa 60 mg in the morning.  3. Trazodone 150-300 mg at bedtime.   PHYSICAL EXAMINATION:  Exam failed to show any acute findings.   LABORATORY WORK UP:  UDS positive for marijuana.   PHYSICAL EXAMINATION:  __________  with some tremors, some nystagmus.   MENTAL STATUS EXAM:  A fully alert, cooperative male, affect anxious,  active withdrawals.  Speech was normal rate, tempo and production.  Thought processes  logical, coherent and relevant.  No active suicidal or  homicidal ideas, no delusions, no hallucinations.  Cognition well-  preserved.   ADMITTING DIAGNOSES:  AXIS I:  Alcohol dependence, alcohol withdrawal,  posttraumatic stress disorder by history, anxiety NOS.  AXIS II:  No diagnosis.  AXIS III:  Chronic back pain.  AXIS IV:  Moderate.  AXIS V:  Upon admission 35; GAF in the last year 60-65.   COURSE IN THE HOSPITAL:  He was admitted.  He was started on individual  and group psychotherapy.  As already stated, initially requesting  alcohol detox, exhibiting withdrawal.  There was some underlying anxiety  and depressed mood.  On June 05, 2008, he continued to have a hard time  with the detox, feeling tremulous, wanting to go to a long-term  treatment facility.  Endorsed he would not be able to make it if he does  not.  Endorsed back pain.  __________ detox, given the amount he was  drinking upon admission.  Continued to have  a difficult time, feeling  shaky, nauseated, unable to give food.  We pursued Librium 25 four times  a day and planned to decrease it.  On June 07, 2008, starting to get  better.  Worried about the relapse.  Endorsing cravings.  He was started  on Campral.  Continued to endorse a lot of anxiety, worries.  He had  been placed on Risperdal.  We went ahead and increased it to 2 mg three  times a day that he had taken in the past successfully.  Endorsed he was  also abusing opiates, and this is probably what is affecting the  withdrawal.  Endorsed nightmares, night sweats, dreams of using drugs,  anxiety and tremors.  Thinking about a friend who hung himself in  December that brought issues having to with the death of his wife.  Was  able to share his feelings about the friend and actively grieve,  becoming tearful through his sharing this information.  He was accepted  to go to a program in [place].  He was looking forward to it.  We  changed the detox with  Librium.  We worked on mood stability, coping  skills, grief and loss, and by June 13, 2008, he was much better, in  full contact with reality.  No active suicidal or homicidal ideas, no  hallucinations or delusions.  He was willing and motivated to pursue  outpatient treatment.   DISCHARGE DIAGNOSES:  AXIS I:  Alcohol dependence, opiate abuse,  posttraumatic stress disorder by history, anxiety NOS, depression NOS.  AXIS II:  No diagnosis.  AXIS III:  Chronic back pain.  AXIS IV:  Moderate.  AXIS V:  Upon discharge 50.   DISCHARGE MEDICATIONS:  1. Celexa 60 mg per day.  2. Risperdal 2 mg three times a day.  3. Voltaren one twice a day.  4. Campral 333 mg two three times a day.   FOLLOWUP:  Follow up at [facility] in [place], as  well as [facility].      Geoffery Lyons, M.D.  Electronically Signed     IL/MEDQ  D:  06/25/2008  T:  06/25/2008  Job:  [id number]

## 2010-07-04 NOTE — H&P (Signed)
NAME:  Thomas Dodson, Thomas Dodson NO.:  [id number]   MEDICAL RECORD NO.:  [id number]          PATIENT TYPE:  IPS   LOCATION:  0501                          FACILITY:  [facility]   PHYSICIAN:  Jeanice Lim, M.D. DATE OF BIRTH:  07/26/1960   DATE OF ADMISSION:  12/10/2003  DATE OF DISCHARGE:                         PSYCHIATRIC ADMISSION ASSESSMENT   IDENTIFYING INFORMATION:  This is a 50 year old white male who is married.  This is a voluntary admission.   HISTORY OF PRESENT ILLNESS:  This patient presented as a walk-in.  The  patient was complaining of increased anxiety with agitated thoughts and  constant flashbacks of seeing his wife in the intensive care unit after her  suicide attempt.  His wife of 10 years overdosed on medications  approximately four weeks ago and was in the ICU.  At that time, she also had  a latex reaction and had a swollen face.  He is unable to remove the image  of her from his mind.  It is causing him to be unable to sleep more than 1-2  hours at a time.  He has had agitated thoughts, constant worry, has been  unable to sleep properly or eat properly and has lost approximately 12  pounds in the past four weeks.  He is tearful, fatigued, feels hopeless that  he cannot go on, unable to concentrate and has had to stop work.  He has a  history of alcohol abuse in the past and has been sober for the past seven  years with no relapse.  He denies any homicidal thoughts or hallucinations  but does describe constant persistent thoughts of seeing his wife's face in  the image in his mind and feels suicidal and having thoughts of overdosing  on medications in order to get some rest and go to sleep and not have to  face this any longer.   PAST PSYCHIATRIC HISTORY:  The patient has no current care.  This is his  first admission to [facility].  He has a distant  history of being admitted to [facility] one time in the past at [facility] two times in the distant past, he states, when he  used to drink alcohol heavily.  He also has a past distant history of a one  time overdose on amitriptyline more than five years ago.   SOCIAL HISTORY:  The patient has been married since April to his wife after  a steady 10-year relationship.  He feels positive about the marriage.  He  also has a mother that lives in [place] who is supportive.  More recently,  his sister, who is a recovering heroin addict, had moved in with them and  that is going well.  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  No legal  charges.   FAMILY HISTORY:  History of alcoholism in his family and also sister with  history of heroin abuse.   ALCOHOL/DRUG HISTORY:  The patient has a history of alcohol abuse  in  remission seven years and now rarely uses cannabis, smoking it less than  once a month.   MEDICAL HISTORY:  The patient is followed by Dr. Valera Castle at [facility].  Medical problems include coronary artery disease.  The patient  is status post myocardial infarction approximately one year ago.  Past  medical history is also remarkable for a fractured spine with a L3 through  L5 fusion in 1994 with rod placement.  Denies any history of seizures,  blackouts or memory loss.  Denies any history of liver disease or kidney  disease.   MEDICATIONS:  Lipitor 5 mg p.o. daily which was confirmed with his primary  care physician and ASA 81 mg daily.   ALLERGIES:  PENICILLIN and CECLOR.   REVIEW OF SYSTEMS:  The patient has had a 12-pound weight loss in the last  four weeks, sleeping 1-2 hours per night during the past four weeks.  This  morning, he has a severe headache that he scores 8-10 on a pain scale of 1-  10.  He denies any chest pain, shortness of breath.  No changes in bowel or  bladder habits.  He does believe he was grinding his teeth at  night.  Does  have some jaw tension and jaw ache and points to the temporomandibular  joints as his source of discomfort.   PHYSICAL EXAMINATION:  GENERAL:  This is a slight-built male, well-  nourished, well-developed, meticulous grooming, fully alert, pleasant with  an anxious affect.  VITAL SIGNS:  Height 5 feet 7 inches tall, 130 pounds.  Temperature 98.2,  pulse 61, respirations 18, blood pressure 134/87.  HEAD:  Normocephalic and atraumatic.  EENT:  Sclerae nonicteric.  Extraocular movements within normal limits.  No  rhinorrhea.  Oropharynx in good condition.  NECK:  Supple.  Full range of motion.  No JVD.  No carotid bruits.  No  thyromegaly.  CHEST:  Clear to auscultation.  BREASTS:  Exam deferred.  CARDIOVASCULAR:  Split S1, S2.  Otherwise, regular rate and rhythm.  Apical  pulse is synchronous with radial pulse.  ABDOMEN:  Flat, soft, nontender, nondistended.  No masses appreciated.  GENITOURINARY:  Deferred.  EXTREMITIES:  Without edema.  SKIN:  Intact.  He does have some tattoos, we note, on his torso.  NEUROLOGIC:  Cranial nerves 2-12 are intact.  Extraocular movements are  normal.  Grip strength equal bilaterally.  Motor smooth.  Sensory grossly  intact.  Romberg without findings.  No focal findings.   LABORATORY DATA:  The patient's CBC and metabolic panel are within normal  limits.  Unremarkable.  TSH is normal at 1.380.  Urinalysis and urine drug  screen are currently pending.   MENTAL STATUS EXAM:  This is a fully alert male who is pleasant and  cooperative with a very anxious affect.  He has bags under his eyes.  He is  tearful intermittently throughout the interview.  Appears quite fatigue.  Eye contact is broken.  Speech is halting, some hesitancy and some response  lag, normal tone, normal pace.  Mood is depressed, anxious, somewhat  agitated.  Thought process is agitated with constant flashbacks of seeing his wife in the ICU, which he is able to describe  graphically, definitely  having agitated thoughts, positive for suicidal thoughts and thinking that  if he could take enough pills that he could finally go to sleep and make  everything go away.  Cognitively, he is intact and oriented x 3.  Also,  please note he is having some thought-blocking.  Insight is satisfactory.  Impulse control and judgment within normal limits.  Intellect within normal  limits.   DIAGNOSES:   AXIS I:  1.  Major depression, recurrent, severe.  2.  Rule out psychosis versus post-traumatic stress disorder.  3.  Rare cannabis abuse.   AXIS II:  Deferred.   AXIS III:  1.  Coronary artery disease.  2.  Status post myocardial infarction one year ago.   AXIS IV:  Severe (stress from wife's mental illness); (having a supportive  marriage and stable home life is an asset to him).   AXIS V:  Current 21; past year 60-70.   PLAN:  Voluntarily admit the patient with 15-minute checks in place with a  goal of alleviating his suicidal ideation and alleviating his flashbacks and  agitated thought.  We are going to start him on Celexa 10 mg daily and  Zyprexa Zydis 5 mg p.o. q.a.m. and 5 mg q.h.s. and then 5 mg q.6h. p.r.n.  for agitation, Midrin 2 caps now for his headache and 1 cap every hour  p.r.n. with a maximum of five in 12 hours.  Because he does have a history  of what he describes as a posterior inferior infarct and has been  complaining of some jaw tension and tightness, we will do cardiac enzymes on  him just to make sure those are clear.  We discussed the plan of care with  the patient and he is in agreement.   ESTIMATED LENGTH OF STAY:  Five days.     Marg   MAS/MEDQ  D:  12/12/2003  T:  12/12/2003  Job:  [id number]

## 2010-07-04 NOTE — Discharge Summary (Signed)
NAME:  Thomas Dodson, Thomas Dodson NO.:  [id number]   MEDICAL RECORD NO.:  [id number]          PATIENT TYPE:  IPS   LOCATION:  0502                          FACILITY:  [facility]   PHYSICIAN:  Geoffery Lyons, M.D.      DATE OF BIRTH:  08/07/1960   DATE OF ADMISSION:  03/03/2004  DATE OF DISCHARGE:  03/07/2004                                 DISCHARGE SUMMARY   CHIEF COMPLAINT AND PRESENT ILLNESS:  This was the second admission to [facility] for this 50 year old married white male voluntarily  admitted.  History of depression, PTSD over wife's suicide attempt.  Endorsed there is a slide show of his wife with swollen face after latex  allergy.  Has not slept, lost 13 pounds, felt he could hurt himself if he  did not get any help.   PAST PSYCHIATRIC HISTORY:  Second time at [facility].  Admitted in  October of 2005.  Depressed.  Had seen Dr. Betti Cruz.   ALCOHOL/DRUG HISTORY:  Denies any active alcohol or drug use.  Sober for 10  years.   MEDICAL HISTORY:  Back pain, has rods in place, history of MI a few years  prior to this admission.   MEDICATIONS:  Gabitril 4 mg in the morning, 8 mg at night, Celexa 40 mg per  day, trazodone 25 mg twice if needed, Abilify 2 mg, takes Norco for back  pain, Abilify for the last two months.   PHYSICAL EXAMINATION:  Performed and failed to show any acute findings.   LABORATORY DATA:  CBC with hemoglobin 13.4.  Blood chemistry within normal  limits.  Glucose 106.  Liver profile within normal limits.  TSH 2.716.   MENTAL STATUS EXAM:  Alert, cooperative male.  Speech normal rate, tempo and  production.  Mood anxious.  Affect flat, tired, unable to sleep.  Thought  process logical, coherent and relevant.  Some obsessive thoughts, intrusive.  Thoughts about the event with his wife.  No delusions.  No hallucinations.  No active suicidal ideation.  Cognition was well-preserved.   ADMISSION DIAGNOSES:   AXIS I:  1.  Depressive  disorder not otherwise specified.  2.  Post-traumatic stress disorder.   AXIS II:  No diagnosis.   AXIS III:  1.  Back pain.  2.  History of myocardial infarction.   AXIS IV:  Moderate.   AXIS V:  Global Assessment of Functioning upon admission 30; highest Global  Assessment of Functioning in the last year 65.   HOSPITAL COURSE:  He was admitted and started in individual and group  psychotherapy.  He was given trazodone for sleep.  Placed on Celexa 40 mg,  Gabitril 4 mg in the morning, 8 mg at night, Norco 5/325 mg, 1 every six  hours as needed, Midrin 2 caps on the onset of headache, then every six  hours as needed.  Eventually, the Seroquel was discontinued.  He was placed  on Risperdal 0.25 mg twice a day and 0.5 mg at night.  He was given Vistaril  25 mg every four hours as needed.  Trazodone was increased to 50 mg and then  to 100 mg at night.  Endorsed that he started having the nightmares, the  flashbacks.  His medications were changed.  Has not been able to sleep, not  able to function.  Afraid he was going to be as bad as when he was admitted  the first time.  Trazodone did help and when his dose was adjusted to 100 mg  and then 150 mg, he slept better.  He did improve markedly.  He was able to  get involved in group and individual sessions.  He was able to again work  with the trauma he went through, worked on Pharmacologist, Optician, dispensing.  On January 20th, he was in full contact with reality.  There were no  suicidal ideation, no homicidal ideation, no hallucinations, no delusions.  Sleeping, resting, motivated, encouraged.  We went ahead and discharged to  outpatient follow-up.   DISCHARGE DIAGNOSES:   AXIS I:  1.  Depressive disorder not otherwise specified.  2.  Post-traumatic stress disorder.   AXIS II:  No diagnosis.   AXIS III:  1.  Back pain.  2.  History of myocardial infarction.   AXIS IV:  Moderate.   AXIS V:  Global Assessment of Functioning  upon discharge 50.   DISCHARGE MEDICATIONS:  1.  Celexa 40 mg per day.  2.  Gabitril 4 mg in the morning, 8 mg at night.  3.  Risperdal 0.25 mg twice a day and 0.5 mg at night.  4.  Trazodone 150 mg, 1-2 at night.  5.  Vicodin 1 every six hours as needed for pain.  6.  Flexeril 10 mg three times a day as needed for muscle spasms.  7.  Midrin 1-2 every one hour, up to 6 in 24 hours as needed for headaches.   FOLLOW UP:  [facility], Lisa __________.     IL/MEDQ  D:  04/07/2004  T:  04/07/2004  Job:  [id number]

## 2010-09-28 ENCOUNTER — Emergency Department (HOSPITAL_COMMUNITY): Payer: PRIVATE HEALTH INSURANCE

## 2010-09-28 ENCOUNTER — Emergency Department (HOSPITAL_COMMUNITY)
Admission: EM | Admit: 2010-09-28 | Discharge: 2010-09-28 | Disposition: A | Discharge status: Home / Self Care | Payer: Self-pay | Attending: Emergency Medicine | Admitting: Emergency Medicine

## 2010-09-28 DIAGNOSIS — F17200 Nicotine dependence, unspecified, uncomplicated: Secondary | ICD-10-CM | POA: Insufficient documentation

## 2010-09-28 DIAGNOSIS — I252 Old myocardial infarction: Secondary | ICD-10-CM | POA: Insufficient documentation

## 2010-09-28 DIAGNOSIS — J189 Pneumonia, unspecified organism: Secondary | ICD-10-CM | POA: Insufficient documentation

## 2010-09-28 DIAGNOSIS — R0682 Tachypnea, not elsewhere classified: Secondary | ICD-10-CM | POA: Insufficient documentation

## 2010-09-28 DIAGNOSIS — J4489 Other specified chronic obstructive pulmonary disease: Secondary | ICD-10-CM | POA: Insufficient documentation

## 2010-09-28 DIAGNOSIS — J449 Chronic obstructive pulmonary disease, unspecified: Secondary | ICD-10-CM | POA: Insufficient documentation

## 2010-09-28 DIAGNOSIS — R079 Chest pain, unspecified: Secondary | ICD-10-CM | POA: Insufficient documentation

## 2010-09-28 DIAGNOSIS — R0602 Shortness of breath: Secondary | ICD-10-CM | POA: Insufficient documentation

## 2012-11-16 ENCOUNTER — Encounter (HOSPITAL_COMMUNITY): Payer: Self-pay | Admitting: *Deleted

## 2012-11-16 ENCOUNTER — Emergency Department (HOSPITAL_COMMUNITY)
Admission: EM | Admit: 2012-11-16 | Discharge: 2012-11-16 | Disposition: A | Discharge status: Home / Self Care | Payer: PRIVATE HEALTH INSURANCE | Attending: Emergency Medicine | Admitting: Emergency Medicine

## 2012-11-16 ENCOUNTER — Emergency Department (HOSPITAL_COMMUNITY): Payer: PRIVATE HEALTH INSURANCE

## 2012-11-16 DIAGNOSIS — Z79899 Other long term (current) drug therapy: Secondary | ICD-10-CM | POA: Insufficient documentation

## 2012-11-16 DIAGNOSIS — L0390 Cellulitis, unspecified: Secondary | ICD-10-CM

## 2012-11-16 DIAGNOSIS — I252 Old myocardial infarction: Secondary | ICD-10-CM | POA: Insufficient documentation

## 2012-11-16 DIAGNOSIS — L0201 Cutaneous abscess of face: Secondary | ICD-10-CM | POA: Insufficient documentation

## 2012-11-16 DIAGNOSIS — F17200 Nicotine dependence, unspecified, uncomplicated: Secondary | ICD-10-CM | POA: Insufficient documentation

## 2012-11-16 DIAGNOSIS — L03211 Cellulitis of face: Secondary | ICD-10-CM | POA: Insufficient documentation

## 2012-11-16 HISTORY — DX: Acute myocardial infarction, unspecified: I21.9

## 2012-11-16 MED ORDER — CLINDAMYCIN HCL 150 MG PO CAPS: 300.0000 mg | ORAL_CAPSULE | Freq: Three times a day (TID) | ORAL | Status: DC

## 2012-11-16 MED ORDER — HYDROMORPHONE HCL PF 1 MG/ML IJ SOLN
1.0000 mg | Freq: Once | INTRAMUSCULAR | Status: AC
  Administered 2012-11-16: 1 mg via INTRAVENOUS
  Filled 2012-11-16: qty 1

## 2012-11-16 MED ORDER — CLINDAMYCIN PHOSPHATE 600 MG/50ML IV SOLN
600.0000 mg | Freq: Once | INTRAVENOUS | Status: AC
  Administered 2012-11-16: 600 mg via INTRAVENOUS
  Filled 2012-11-16: qty 50

## 2012-11-16 MED ORDER — OXYCODONE-ACETAMINOPHEN 5-325 MG PO TABS
2.0000 | ORAL_TABLET | Freq: Once | ORAL | Status: AC
  Administered 2012-11-16: 2 via ORAL
  Filled 2012-11-16: qty 2

## 2012-11-16 MED ORDER — OXYCODONE-ACETAMINOPHEN 5-325 MG PO TABS: 2.0000 | ORAL_TABLET | Freq: Four times a day (QID) | ORAL | Status: DC | PRN

## 2012-11-16 NOTE — ED Notes (Signed)
Patient states he felt a bump on his nose Sunday. Since then he started to have redness, swelling and drainage from nose.

## 2012-11-16 NOTE — ED Provider Notes (Signed)
CSN: [id number]     Arrival date & time 11/16/12  1231 History  This chart was scribed for Roxy Horseman, PA, working with Harrold Donath R. Rubin Payor, MD, by Ardelia Mems ED Scribe. This patient was seen in room WTR7/WTR7 and the patient's care was started at 12:57 PM.   Chief Complaint  Patient presents with  . Facial Swelling    The history is provided by the patient. No language interpreter was used.    HPI Comments: Thomas Dodson is a 52 y.o. male who presents to the Emergency Department complaining of gradually worsening swelling to his nose over the past 3 days. He reports associated redness and constant, moderate pain to the area which has also gradually worsened. He also reports associated pain described as "pounding" in his forehead. He states that 3 days ago he first noticed an area of swelling to his nose, which resembled a pimple, and was about the size of a match head. He states that he popped the area and it has worsened since. He states that he has applied warm compresses to the affected area without relief. 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 He states that he is allergic ("breaks out into hives") to Penicillin. He denies fever or any other pain or symptoms.   Past Medical History  Diagnosis Date  . Myocardial infarct    History reviewed. No pertinent past surgical history. History reviewed. No pertinent family history. History  Substance Use Topics  . Smoking status: Current Every Day Smoker  . Smokeless tobacco: Not on file  . Alcohol Use: No    Review of Systems  All other systems reviewed and are negative.  A complete 10 system review of systems was obtained and all systems are negative except as noted in the HPI and PMH.   Allergies  Cefaclor and Penicillins  Home Medications   Current Outpatient Rx  Name  Route  Sig  Dispense  Refill  . albuterol  (PROVENTIL HFA;VENTOLIN HFA) 108 (90 BASE) MCG/ACT inhaler   Inhalation   Inhale 2 puffs into the lungs every 6 (six) hours as needed for wheezing.         Marland Kitchen ibuprofen (ADVIL,MOTRIN) 200 MG tablet   Oral   Take 400 mg by mouth every 6 (six) hours as needed for pain.         Marland Kitchen tiotropium (SPIRIVA HANDIHALER) 18 MCG inhalation capsule   Inhalation   Place 18 mcg into inhaler and inhale daily.           Triage Vitals: BP 130/80  Pulse 74  Temp(Src) 98.4 F (36.9 C) (Oral)  SpO2 100%  Physical Exam  Nursing note and vitals reviewed. Constitutional: He is oriented to person, place, and time. He appears well-developed and well-nourished. No distress.  HENT:  Head: Normocephalic and atraumatic.  Nose is moderately erythematous and very tender to palpation. No obvious fluid accumulation or abscess. Nares are patent bilaterally.  Eyes: EOM are normal.  Neck: Neck supple. No tracheal deviation present.  Cardiovascular: Normal rate.   Pulmonary/Chest: Effort normal. No respiratory distress.  Musculoskeletal: Normal range of motion.  Neurological: He is alert and oriented to person, place, and time.  Skin: Skin is warm and dry.  Psychiatric: He has a normal mood and affect. His behavior is normal.    ED Course  Procedures (including critical care time)  DIAGNOSTIC  STUDIES: Oxygen Saturation is 100% on RA, normal by my interpretation.    COORDINATION OF CARE: 1:01 PM- Discussed clinical suspicion of a skin infection. Discussed plan to obtain maxillofacial CT, to observe if the infection extends into his face. Advised pt of the length of time this may take to obtain. Will order Percocet for pain. Pt advised of plan for treatment and pt agrees.  Medications  oxyCODONE-acetaminophen (PERCOCET/ROXICET) 5-325 MG per tablet 2 tablet (2 tablets Oral Given 11/16/12 1306)  clindamycin (CLEOCIN) IVPB 600 mg (600 mg Intravenous New Bag/Given 11/16/12 1533)  HYDROmorphone (DILAUDID)  injection 1 mg (1 mg Intravenous Given 11/16/12 1533)   Labs Review Labs Reviewed - No data to display Imaging Review Ct Maxillofacial Wo Cm  11/16/2012   CLINICAL DATA:  Nasal pain/swelling  EXAM: CT MAXILLOFACIAL WITHOUT CONTRAST  TECHNIQUE: Multidetector CT imaging of the maxillofacial structures was performed. Multiplanar CT image reconstructions were also generated. A small metallic BB was placed on the right temple in order to reliably differentiate right from left.  COMPARISON:  None.  FINDINGS: No evidence of maxillofacial fracture.  Mild mucosal thickening in the right maxillary sinus. Visualized paranasal sinuses are otherwise clear. Minimal partial opacification of the left mastoid air cells (series 4/ image 49).  The bilateral orbits, including the globes and retrocrural soft tissues, are within normal limits.  Possible mild soft tissue swelling along the right anterior nasal bridge (series 3/image 41). No drainable fluid collection/abscess.  Visualized brain parenchyma is unremarkable.  Visualized cervical spine is normal to C4.  IMPRESSION: Possible mild soft tissue swelling along the anterior nasal bridge.  No drainable fluid collection/abscess.   Electronically Signed   By: Charline Bills M.D.   On: 11/16/2012 14:32    MDM   1. Cellulitis    CT is negative for abscess or fluid collection. Pt discussed with Dr. Rubin Payor, who recommends a dose of IV Clindamycin.   Pt feels better after a dose of Dilaudid and Clindamycin. Will discharge to home with PO clindamycin  and pain medicine. Specific return precautions were given. Pt understands and agrees with plan. He is stable and ready for discharge.   Filed Vitals:   11/16/12 1238  BP: 130/80  Pulse: 74  Temp: 98.4 F (36.9 C)     I personally performed the services described in this documentation, which was scribed in my presence. The recorded information has been reviewed and is accurate.    Roxy Horseman,  PA-C 11/16/12 [phone]

## 2012-11-17 NOTE — ED Provider Notes (Signed)
Medical screening examination/treatment/procedure(s) were performed by non-physician practitioner and as supervising physician I was immediately available for consultation/collaboration.  Arleigh Odowd R. Verenice Westrich, MD 11/17/12 1540 

## 2013-09-10 ENCOUNTER — Emergency Department: Payer: Self-pay | Admitting: Emergency Medicine

## 2013-09-10 LAB — URINALYSIS, COMPLETE
Bacteria: NONE SEEN
Bilirubin,UR: NEGATIVE
Blood: NEGATIVE
Glucose,UR: NEGATIVE mg/dL (ref 0–75)
Ketone: NEGATIVE
Leukocyte Esterase: NEGATIVE
Nitrite: NEGATIVE
Ph: 7 (ref 4.5–8.0)
Protein: NEGATIVE
RBC,UR: <1 /HPF (ref 0–5)
Specific Gravity: 1.004 (ref 1.003–1.030)
Squamous Epithelial: <1
WBC UR: NONE SEEN /HPF (ref 0–5)

## 2013-09-12 ENCOUNTER — Emergency Department: Payer: Self-pay | Admitting: Emergency Medicine

## 2014-07-23 ENCOUNTER — Emergency Department: Payer: PRIVATE HEALTH INSURANCE

## 2014-07-23 ENCOUNTER — Emergency Department
Admission: EM | Admit: 2014-07-23 | Discharge: 2014-07-23 | Disposition: A | Discharge status: Home / Self Care | Payer: PRIVATE HEALTH INSURANCE | Attending: Emergency Medicine | Admitting: Emergency Medicine

## 2014-07-23 ENCOUNTER — Encounter: Payer: Self-pay | Admitting: Emergency Medicine

## 2014-07-23 DIAGNOSIS — G8929 Other chronic pain: Secondary | ICD-10-CM | POA: Insufficient documentation

## 2014-07-23 DIAGNOSIS — M545 Low back pain, unspecified: Secondary | ICD-10-CM

## 2014-07-23 DIAGNOSIS — Z720 Tobacco use: Secondary | ICD-10-CM | POA: Insufficient documentation

## 2014-07-23 DIAGNOSIS — Z791 Long term (current) use of non-steroidal anti-inflammatories (NSAID): Secondary | ICD-10-CM | POA: Insufficient documentation

## 2014-07-23 HISTORY — DX: Chronic obstructive pulmonary disease, unspecified: J44.9

## 2014-07-23 HISTORY — DX: Old myocardial infarction: I25.2

## 2014-07-23 LAB — URINALYSIS COMPLETE WITH MICROSCOPIC (ARMC ONLY)
BACTERIA UA: NONE SEEN
BILIRUBIN URINE: NEGATIVE
GLUCOSE, UA: NEGATIVE mg/dL
Hgb urine dipstick: NEGATIVE
Ketones, ur: NEGATIVE mg/dL
LEUKOCYTES UA: NEGATIVE
Nitrite: NEGATIVE
PH: 7 (ref 5.0–8.0)
Protein, ur: NEGATIVE mg/dL
SQUAMOUS EPITHELIAL / LPF: NONE SEEN
Specific Gravity, Urine: 1.002 — ABNORMAL LOW (ref 1.005–1.030)

## 2014-07-23 MED ORDER — MELOXICAM 15 MG PO TABS: 15.0000 mg | ORAL_TABLET | Freq: Every day | ORAL | Status: DC

## 2014-07-23 MED ORDER — HYDROMORPHONE HCL 1 MG/ML IJ SOLN
INTRAMUSCULAR | Status: AC
  Filled 2014-07-23: qty 1

## 2014-07-23 MED ORDER — KETOROLAC TROMETHAMINE 30 MG/ML IJ SOLN
30.0000 mg | Freq: Once | INTRAMUSCULAR | Status: AC
  Administered 2014-07-23: 30 mg via INTRAVENOUS

## 2014-07-23 MED ORDER — HYDROMORPHONE HCL 1 MG/ML IJ SOLN
1.0000 mg | Freq: Once | INTRAMUSCULAR | Status: AC
  Administered 2014-07-23: 1 mg via INTRAVENOUS

## 2014-07-23 MED ORDER — HYDROMORPHONE HCL 1 MG/ML IJ SOLN
1.0000 mg | Freq: Once | INTRAMUSCULAR | Status: AC
  Administered 2014-07-23: 1 mg via INTRAMUSCULAR

## 2014-07-23 MED ORDER — KETOROLAC TROMETHAMINE 30 MG/ML IJ SOLN
INTRAMUSCULAR | Status: AC
  Filled 2014-07-23: qty 1

## 2014-07-23 MED ORDER — DEXAMETHASONE SODIUM PHOSPHATE 10 MG/ML IJ SOLN
10.0000 mg | Freq: Once | INTRAMUSCULAR | Status: AC
  Administered 2014-07-23: 10 mg via INTRAVENOUS

## 2014-07-23 MED ORDER — DEXAMETHASONE SODIUM PHOSPHATE 10 MG/ML IJ SOLN
INTRAMUSCULAR | Status: AC
  Filled 2014-07-23: qty 1

## 2014-07-23 MED ORDER — DIAZEPAM 2 MG PO TABS: 2.0000 mg | ORAL_TABLET | Freq: Four times a day (QID) | ORAL | Status: DC | PRN

## 2014-07-23 MED ORDER — OXYCODONE-ACETAMINOPHEN 5-325 MG PO TABS
1.0000 | ORAL_TABLET | Freq: Four times a day (QID) | ORAL | Status: DC | PRN
Start: 2014-07-23 — End: 2015-07-28

## 2014-07-23 NOTE — ED Notes (Signed)
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 He does a lot of heavy lifting at his job. In 1993 he had surgery on his back and rod and screws were out in. Pt states that he is in unbearable pain and is bringing him to tears.

## 2014-07-23 NOTE — ED Notes (Addendum)
Pt returned from MRI, pt states pain from 3rd dose of dilaudid improved but when he went to MRI pain returned, rates pain 8/10 at present, NP advised and order received

## 2014-07-23 NOTE — ED Provider Notes (Signed)
[facility] Emergency Department Provider Note  ____________________________________________  Time seen: Approximately 9:34 AM  I have reviewed the triage vital signs and the nursing notes.   HISTORY  Chief Complaint Back Pain    HPI Thomas Dodson is a 54 y.o. male who presents to the emergency department for acute on chronic back pain. He states his last flare was about a year ago. He started a new job about a month ago and has been doing more lifting and different activities. He has a history of a lumbar fusion with internal fixation. He denies a specific injury. Pain started upon awakening Saturday morning and progressed through today. He has had no relief with his typical over-the-counter medications. He states the pain is in the same area as the chronic pain, but much worse.   Past Medical History  Diagnosis Date  . Myocardial infarct   . COPD (chronic obstructive pulmonary disease)   . MI, old     Patient Active Problem List   Diagnosis Date Noted  . HYPERLIPIDEMIA 11/19/2006  . DEPRESSION 11/19/2006  . MYOCARDIAL INFARCTION, HX OF 11/19/2006  . LOW BACK PAIN 11/19/2006  . BACK PAIN, CHRONIC 11/19/2006  . HEADACHE 11/19/2006  . ALCOHOL ABUSE, HX OF 11/19/2006  . CHICKENPOX, HX OF 11/19/2006    Past Surgical History  Procedure Laterality Date  . Back surgery      Current Outpatient Rx  Name  Route  Sig  Dispense  Refill  . albuterol (PROVENTIL HFA;VENTOLIN HFA) 108 (90 BASE) MCG/ACT inhaler   Inhalation   Inhale 2 puffs into the lungs every 6 (six) hours as needed for wheezing.         . clindamycin (CLEOCIN) 150 MG capsule   Oral   Take 2 capsules (300 mg total) by mouth 3 (three) times daily. May dispense as  capsules   60 capsule   0   . diazepam (VALIUM) 2 MG tablet   Oral   Take 1 tablet (2 mg total) by mouth every 6 (six) hours as needed for anxiety.   12 tablet   0   . meloxicam (MOBIC) 15 MG tablet   Oral    Take 1 tablet (15 mg total) by mouth daily.   30 tablet   0   . oxyCODONE-acetaminophen (ROXICET) 5-325 MG per tablet   Oral   Take 1 tablet by mouth every 6 (six) hours as needed.   20 tablet   0   . tiotropium (SPIRIVA HANDIHALER) 18 MCG inhalation capsule   Inhalation   Place 18 mcg into inhaler and inhale daily.           Allergies Cefaclor and Penicillins  No family history on file.  Social History History  Substance Use Topics  . Smoking status: Current Every Day Smoker -- 1.00 packs/day    Types: Cigarettes  . Smokeless tobacco: Not on file  . Alcohol Use: Not on file    Review of Systems Constitutional: No fever/chills Eyes: No visual changes. ENT: No sore throat. Cardiovascular: Denies chest pain. Respiratory: Denies shortness of breath. Gastrointestinal: No abdominal pain.  No nausea, no vomiting.  No diarrhea.  No constipation. Genitourinary: Negative for dysuria. Musculoskeletal: Positive for lower back pain that radiates into the pelvis. Skin: Negative for rash. Neurological: Negative for headaches, focal weakness or numbness.  10-point ROS otherwise negative.  ____________________________________________   PHYSICAL EXAM:  VITAL SIGNS: ED Triage Vitals  Enc Vitals Group     BP  07/23/14 0856 186/110 mmHg     Pulse Rate 07/23/14 0856 95     Resp 07/23/14 0856 20     Temp 07/23/14 0856 98.5 F (36.9 C)     Temp Source 07/23/14 0856 Oral     SpO2 07/23/14 0856 100 %     Weight 07/23/14 0856 140 lb (63.504 kg)     Height 07/23/14 0856 5\' 7"  (1.702 m)     Head Cir --      Peak Flow --      Pain Score 07/23/14 0857 10     Pain Loc --      Pain Edu? --      Excl. in GC? --     Constitutional: Alert and oriented. Tearful and appears to be in pain. Eyes: Conjunctivae are normal. PERRL. EOMI. Head: Atraumatic. Nose: No congestion/rhinnorhea. Mouth/Throat: Mucous membranes are moist.  Oropharynx non-erythematous. Neck: No stridor.    Cardiovascular:   Good peripheral circulation. Respiratory: Normal respiratory effort.  No retractions. Lungs CTAB. Gastrointestinal: Soft and nontender. No distention. No abdominal bruits. No CVA tenderness. Musculoskeletal: No lower extremity tenderness nor edema.  No joint effusions. Neurologic:  Normal speech and language. Bilateral lower extremity weakness present, although may be related to pain with movement. Skin:  Skin is warm, dry and intact. No rash noted. Psychiatric: Mood and affect are normal. Speech and behavior are normal.  ____________________________________________   LABS (all labs ordered are listed, but only abnormal results are displayed)  Labs Reviewed  URINALYSIS COMPLETEWITH MICROSCOPIC ([facility] ONLY) - Abnormal; Notable for the following:    Color, Urine COLORLESS (*)    APPearance CLEAR (*)    Specific Gravity, Urine 1.002 (*)    All other components within normal limits   ____________________________________________  EKG   ____________________________________________  RADIOLOGY  No acute pathology and lumbar spine film.  MRI: No acute pathology to explain severity of pain. ____________________________________________   PROCEDURES  Procedure(s) performed: None  Critical Care performed: No  ____________________________________________   INITIAL IMPRESSION / ASSESSMENT AND PLAN / ED COURSE  Pertinent labs & imaging results that were available during my care of the patient were reviewed by me and considered in my medical decision making (see chart for details).  Initial impression: Acute on chronic back pain. Pain medication and lumbar spine x-rays ordered.  ----------------------------------------- 10:43 AM on 07/23/2014 -----------------------------------------  Little improvement in pain after IM Dilaudid. Decadron and additional pain medications ordered.  ----------------------------------------- 11:21 AM on  07/23/2014 -----------------------------------------  Patient continues to have severe pain with any movement. MRI of lumbar spine without contrast ordered.  ----------------------------------------- 1:02 PM on 07/23/2014 -----------------------------------------  Return from MRI. Remains very uncomfortable. Pain is 8 out of 10. Toradol ordered. Awaiting MRI results.  ----------------------------------------- 2:16 PM on 07/23/2014 -----------------------------------------  Radiology results reviewed with patient and family. Patient was advised to rest over the next few days and avoid any lifting. He was advised to follow-up with primary care or orthopedics for symptoms that are not improving over the next few days. He was able to stand unassisted and take a few steps. He reports that he has had significant pain relief he has been in the emergency department, however the pain continues to worsen with any movement. He was advised to wear a back brace while at work. He is also advised to take meloxicam daily.  ____________________________________________   FINAL CLINICAL IMPRESSION(S) / ED DIAGNOSES  Final diagnoses:  Back pain, lumbosacral  Severe lumbar pain  Chinita Pester, FNP 07/23/14 1419  Sharyn Creamer, MD 07/23/14 [phone]

## 2014-07-23 NOTE — ED Notes (Signed)
Patient transported to X-ray 

## 2014-10-03 ENCOUNTER — Ambulatory Visit: Payer: PRIVATE HEALTH INSURANCE | Admitting: Anesthesiology

## 2015-06-21 ENCOUNTER — Emergency Department
Admission: EM | Admit: 2015-06-21 | Discharge: 2015-06-21 | Disposition: A | Discharge status: Home / Self Care | Payer: Worker's Compensation | Attending: Emergency Medicine | Admitting: Emergency Medicine

## 2015-06-21 ENCOUNTER — Emergency Department: Payer: Worker's Compensation

## 2015-06-21 DIAGNOSIS — S4991XA Unspecified injury of right shoulder and upper arm, initial encounter: Secondary | ICD-10-CM | POA: Diagnosis present

## 2015-06-21 DIAGNOSIS — S46911A Strain of unspecified muscle, fascia and tendon at shoulder and upper arm level, right arm, initial encounter: Secondary | ICD-10-CM | POA: Diagnosis not present

## 2015-06-21 DIAGNOSIS — F17210 Nicotine dependence, cigarettes, uncomplicated: Secondary | ICD-10-CM | POA: Diagnosis not present

## 2015-06-21 DIAGNOSIS — Z880 Allergy status to penicillin: Secondary | ICD-10-CM | POA: Diagnosis not present

## 2015-06-21 DIAGNOSIS — Y990 Civilian activity done for income or pay: Secondary | ICD-10-CM | POA: Insufficient documentation

## 2015-06-21 DIAGNOSIS — S20211A Contusion of right front wall of thorax, initial encounter: Secondary | ICD-10-CM | POA: Insufficient documentation

## 2015-06-21 DIAGNOSIS — Y9389 Activity, other specified: Secondary | ICD-10-CM | POA: Insufficient documentation

## 2015-06-21 DIAGNOSIS — Y929 Unspecified place or not applicable: Secondary | ICD-10-CM | POA: Diagnosis not present

## 2015-06-21 DIAGNOSIS — J449 Chronic obstructive pulmonary disease, unspecified: Secondary | ICD-10-CM | POA: Insufficient documentation

## 2015-06-21 DIAGNOSIS — E785 Hyperlipidemia, unspecified: Secondary | ICD-10-CM | POA: Diagnosis not present

## 2015-06-21 DIAGNOSIS — I252 Old myocardial infarction: Secondary | ICD-10-CM | POA: Insufficient documentation

## 2015-06-21 DIAGNOSIS — F329 Major depressive disorder, single episode, unspecified: Secondary | ICD-10-CM | POA: Insufficient documentation

## 2015-06-21 DIAGNOSIS — W1839XA Other fall on same level, initial encounter: Secondary | ICD-10-CM | POA: Diagnosis not present

## 2015-06-21 MED ORDER — NAPROXEN 500 MG PO TBEC
500.0000 mg | DELAYED_RELEASE_TABLET | Freq: Two times a day (BID) | ORAL | Status: DC
Start: 2015-06-21 — End: 2018-04-12

## 2015-06-21 MED ORDER — KETOROLAC TROMETHAMINE 30 MG/ML IJ SOLN
INTRAMUSCULAR | Status: AC
  Administered 2015-06-21: 30 mg
  Filled 2015-06-21: qty 1

## 2015-06-21 MED ORDER — CYCLOBENZAPRINE HCL 10 MG PO TABS
5.0000 mg | ORAL_TABLET | Freq: Once | ORAL | Status: AC
  Administered 2015-06-21: 5 mg via ORAL
  Filled 2015-06-21: qty 1

## 2015-06-21 MED ORDER — KETOROLAC TROMETHAMINE 10 MG PO TABS: 10.0000 mg | ORAL_TABLET | Freq: Three times a day (TID) | ORAL | Status: DC

## 2015-06-21 MED ORDER — CYCLOBENZAPRINE HCL 5 MG PO TABS: 5.0000 mg | ORAL_TABLET | Freq: Three times a day (TID) | ORAL | Status: DC | PRN

## 2015-06-21 MED ORDER — HYDROCODONE-ACETAMINOPHEN 5-325 MG PO TABS
1.0000 | ORAL_TABLET | Freq: Once | ORAL | Status: AC
  Administered 2015-06-21: 1 via ORAL
  Filled 2015-06-21: qty 1

## 2015-06-21 MED ORDER — HYDROCODONE-ACETAMINOPHEN 5-325 MG PO TABS: 1.0000 | ORAL_TABLET | Freq: Four times a day (QID) | ORAL | Status: DC | PRN

## 2015-06-21 MED ORDER — KETOROLAC TROMETHAMINE 60 MG/2ML IM SOLN: 30.0000 mg | Freq: Once | INTRAMUSCULAR | Status: DC

## 2015-06-21 NOTE — ED Notes (Signed)
E signature pad not working 

## 2015-06-21 NOTE — ED Provider Notes (Signed)
[facility] Emergency Department Provider Note ____________________________________________  Time seen: 1336  I have reviewed the triage vital signs and the nursing notes.  HISTORY  Chief Complaint  Fall  HPI Thomas Dodson is a 55 y.o. male, presents to the emergency department today with 1 day history of a fall while at work. He states he fell between two floor joists. He landed on one joist with his right side and tried to catch himself with his right arm. He continued to fall to the ground. He denies LOC. States that most of his pain the right side of the chest wall and ribs, he has increased pain with breathing. He also notes some right shoulder pain with ROM. Has history of COPD and is a current smoker. Denies other injury at this time. He reports his pain at a 10/10 in triage.  Past Medical History  Diagnosis Date  . Myocardial infarct (HCC)   . COPD (chronic obstructive pulmonary disease) (HCC)   . MI, old     Patient Active Problem List   Diagnosis Date Noted  . HYPERLIPIDEMIA 11/19/2006  . DEPRESSION 11/19/2006  . MYOCARDIAL INFARCTION, HX OF 11/19/2006  . LOW BACK PAIN 11/19/2006  . BACK PAIN, CHRONIC 11/19/2006  . HEADACHE 11/19/2006  . ALCOHOL ABUSE, HX OF 11/19/2006  . CHICKENPOX, HX OF 11/19/2006    Past Surgical History  Procedure Laterality Date  . Back surgery      Current Outpatient Rx  Name  Route  Sig  Dispense  Refill  . albuterol (PROVENTIL HFA;VENTOLIN HFA) 108 (90 BASE) MCG/ACT inhaler   Inhalation   Inhale 2 puffs into the lungs every 6 (six) hours as needed for wheezing.         . clindamycin (CLEOCIN) 150 MG capsule   Oral   Take 2 capsules (300 mg total) by mouth 3 (three) times daily. May dispense as 150mg  capsules   60 capsule   0   . cyclobenzaprine (FLEXERIL) 5 MG tablet   Oral   Take 1 tablet (5 mg total) by mouth every 8 (eight) hours as needed for muscle spasms.   12 tablet   0   . diazepam (VALIUM) 2  MG tablet   Oral   Take 1 tablet (2 mg total) by mouth every 6 (six) hours as needed for anxiety.   12 tablet   0   . HYDROcodone-acetaminophen (NORCO) 5-325 MG tablet   Oral   Take 1 tablet by mouth every 6 (six) hours as needed for moderate pain.   10 tablet   0   . ketorolac (TORADOL) 10 MG tablet   Oral   Take 1 tablet (10 mg total) by mouth every 8 (eight) hours.   15 tablet   0   . meloxicam (MOBIC) 15 MG tablet   Oral   Take 1 tablet (15 mg total) by mouth daily.   30 tablet   0   . naproxen (EC NAPROSYN) 500 MG EC tablet   Oral   Take 1 tablet (500 mg total) by mouth 2 (two) times daily with a meal.   30 tablet   0   . oxyCODONE-acetaminophen (ROXICET) 5-325 MG per tablet   Oral   Take 1 tablet by mouth every 6 (six) hours as needed.   20 tablet   0   . tiotropium (SPIRIVA HANDIHALER) 18 MCG inhalation capsule   Inhalation   Place 18 mcg into inhaler and inhale daily.  Allergies Cefaclor and Penicillins  No family history on file.  Social History Social History  Substance Use Topics  . Smoking status: Current Every Day Smoker -- 1.00 packs/day    Types: Cigarettes  . Smokeless tobacco: None  . Alcohol Use: None   Review of Systems  Constitutional: Negative for fever. Cardiovascular: Negative for chest pain. Respiratory: Negative for shortness of breath. Increased pain with breathing. Gastrointestinal: Negative for abdominal pain, vomiting and diarrhea. Genitourinary: Negative for dysuria. Musculoskeletal: Positive for right shoulder and rib pain. Negative for back pain. Skin: Negative for rash. Denies bruising Neurological: Negative for headaches, focal weakness or numbness. ____________________________________________  PHYSICAL EXAM:  VITAL SIGNS: ED Triage Vitals  Enc Vitals Group     BP 06/21/15 1258 143/98 mmHg     Pulse Rate 06/21/15 1258 75     Resp 06/21/15 1258 18     Temp 06/21/15 1258 97.6 F (36.4 C)     Temp  Source 06/21/15 1258 Oral     SpO2 06/21/15 1258 100 %     Weight 06/21/15 1258 140 lb (63.504 kg)     Height 06/21/15 1258 5\' 7"  (1.702 m)     Head Cir --      Peak Flow --      Pain Score 06/21/15 1258 10     Pain Loc --      Pain Edu? --      Excl. in GC? --    Constitutional: Alert and oriented. Well appearing and in no distress. Head: Normocephalic and atraumatic. Cardiovascular: Normal rate, regular rhythm.  Respiratory: Normal respiratory effort. No wheezes/rales/rhonchi. Gastrointestinal: Soft and nontender. No distention. Musculoskeletal: No obvious chest wall deformity, bruise, abrasion, or ecchymosis is appreciated. TTP along the right shoulder. Full ROM in right shoulder with moderate pain. TTP along right ribs. Neurologic:  Normal gait without ataxia. Normal speech and language. No gross focal neurologic deficits are appreciated. Skin:  Skin is warm, dry and intact. No rash noted. ____________________________________________  RADIOLOGY  RIGHT SHOULDER - 2+ VIEW  COMPARISON: June 09, 2010.  FINDINGS: There is no evidence of fracture or dislocation. There is no evidence of arthropathy or other focal bone abnormality. Soft tissues are unremarkable.  IMPRESSION: Normal right shoulder.  RIGHT RIBS AND CHEST - 3+ VIEW  COMPARISON: Chest radiograph 09/28/2010  FINDINGS: Normal cardiac and mediastinal contours. No consolidative pulmonary opacities. No pleural effusion or pneumothorax. 6 mm nodular opacity within the left mid lung. Regional skeleton is unremarkable. No evidence for displaced rib fracture.  IMPRESSION: No acute cardiopulmonary process. No displaced rib fracture.  6 mm left mid lung nodule. This needs dedicated evaluation with chest CT in the non acute setting. ____________________________________________  INITIAL IMPRESSION / ASSESSMENT AND PLAN / ED COURSE  Patient with a mechanical fall with pain to the chest wall on the right. He also  has a shoulder strain of the right without evidence of dislocation or fracture. His chest x-ray including rib detail are negative for any acute rib fracture or cardiomegaly process. He does have incidental finding of a left lung nodule that the patient is made aware. He will follow-up with his primary care provider or the local clinics for reevaluation in 4-6 weeks as directed. Patient is discharged with prescriptions for ketorolac, Flexeril, and Vicodin as well as Naprosyn to dose at the conclusion of his Toradol regimen. He is given instructions on management of acute musculoskeletal pain and will follow the local clinics as directed. ____________________________________________  FINAL CLINICAL  IMPRESSION(S) / ED DIAGNOSES  Final diagnoses:  Shoulder strain, right, initial encounter  Chest wall contusion, right, initial encounter       Lissa Hoard, PA-C 06/23/15 2353  Rockne Menghini, MD 07/01/15 [phone]

## 2015-06-21 NOTE — Discharge Instructions (Signed)
Cryotherapy °Cryotherapy means treatment with cold. Ice or gel packs can be used to reduce both pain and swelling. Ice is the most helpful within the first 24 to 48 hours after an injury or flare-up from overusing a muscle or joint. Sprains, strains, spasms, burning pain, shooting pain, and aches can all be eased with ice. Ice can also be used when recovering from surgery. Ice is effective, has very few side effects, and is safe for most people to use. °PRECAUTIONS  °Ice is not a safe treatment option for people with: °· Raynaud phenomenon. This is a condition affecting small blood vessels in the extremities. Exposure to cold may cause your problems to return. °· Cold hypersensitivity. There are many forms of cold hypersensitivity, including: °· Cold urticaria. Red, itchy hives appear on the skin when the tissues begin to warm after being iced. °· Cold erythema. This is a red, itchy rash caused by exposure to cold. °· Cold hemoglobinuria. Red blood cells break down when the tissues begin to warm after being iced. The hemoglobin that carry oxygen are passed into the urine because they cannot combine with blood proteins fast enough. °· Numbness or altered sensitivity in the area being iced. °If you have any of the following conditions, do not use ice until you have discussed cryotherapy with your caregiver: °· Heart conditions, such as arrhythmia, angina, or chronic heart disease. °· High blood pressure. °· Healing wounds or open skin in the area being iced. °· Current infections. °· Rheumatoid arthritis. °· Poor circulation. °· Diabetes. °Ice slows the blood flow in the region it is applied. This is beneficial when trying to stop inflamed tissues from spreading irritating chemicals to surrounding tissues. However, if you expose your skin to cold temperatures for too long or without the proper protection, you can damage your skin or nerves. Watch for signs of skin damage due to cold. °HOME CARE INSTRUCTIONS °Follow  these tips to use ice and cold packs safely. °· Place a dry or damp towel between the ice and skin. A damp towel will cool the skin more quickly, so you may need to shorten the time that the ice is used. °· For a more rapid response, add gentle compression to the ice. °· Ice for no more than 10 to 20 minutes at a time. The bonier the area you are icing, the less time it will take to get the benefits of ice. °· Check your skin after 5 minutes to make sure there are no signs of a poor response to cold or skin damage. °· Rest 20 minutes or more between uses. °· Once your skin is numb, you can end your treatment. You can test numbness by very lightly touching your skin. The touch should be so light that you do not see the skin dimple from the pressure of your fingertip. When using ice, most people will feel these normal sensations in this order: cold, burning, aching, and numbness. °· Do not use ice on someone who cannot communicate their responses to pain, such as small children or people with dementia. °HOW TO MAKE AN ICE PACK °Ice packs are the most common way to use ice therapy. Other methods include ice massage, ice baths, and cryosprays. Muscle creams that cause a cold, tingly feeling do not offer the same benefits that ice offers and should not be used as a substitute unless recommended by your caregiver. °To make an ice pack, do one of the following: °· Place crushed ice or a   bag of frozen vegetables in a sealable plastic bag. Squeeze out the excess air. Place this bag inside another plastic bag. Slide the bag into a pillowcase or place a damp towel between your skin and the bag.  Mix 3 parts water with 1 part rubbing alcohol. Freeze the mixture in a sealable plastic bag. When you remove the mixture from the freezer, it will be slushy. Squeeze out the excess air. Place this bag inside another plastic bag. Slide the bag into a pillowcase or place a damp towel between your skin and the bag. SEEK MEDICAL CARE  IF:  You develop white spots on your skin. This may give the skin a blotchy (mottled) appearance.  Your skin turns blue or pale.  Your skin becomes waxy or hard.  Your swelling gets worse. MAKE SURE YOU:   Understand these instructions.  Will watch your condition.  Will get help right away if you are not doing well or get worse.   This information is not intended to replace advice given to you by your health care provider. Make sure you discuss any questions you have with your health care provider.   Document Released: 09/29/2010 Document Revised: 02/23/2014 Document Reviewed: 09/29/2010 Elsevier Interactive Patient Education 2016 [employer]  Muscle Strain A muscle strain (pulled muscle) happens when a muscle is stretched beyond normal length. It happens when a sudden, violent force stretches your muscle too far. Usually, a few of the fibers in your muscle are torn. Muscle strain is common in athletes. Recovery usually takes 1-2 weeks. Complete healing takes 5-6 weeks.  HOME CARE   Follow the PRICE method of treatment to help your injury get better. Do this the first 2-3 days after the injury:  Protect. Protect the muscle to keep it from getting injured again.  Rest. Limit your activity and rest the injured body part.  Ice. Put ice in a plastic bag. Place a towel between your skin and the bag. Then, apply the ice and leave it on from 15-20 minutes each hour. After the third day, switch to moist heat packs.  Compression. Use a splint or elastic bandage on the injured area for comfort. Do not put it on too tightly.  Elevate. Keep the injured body part above the level of your heart.  Only take medicine as told by your doctor.  Warm up before doing exercise to prevent future muscle strains. GET HELP IF:   You have more pain or puffiness (swelling) in the injured area.  You feel numbness, tingling, or notice a loss of strength in the injured area. MAKE SURE YOU:    Understand these instructions.  Will watch your condition.  Will get help right away if you are not doing well or get worse.   This information is not intended to replace advice given to you by your health care provider. Make sure you discuss any questions you have with your health care provider.   Document Released: 11/12/2007 Document Revised: 11/23/2012 Document Reviewed: 09/01/2012 Elsevier Interactive Patient Education 2016 [employer]  Rib Contusion A rib contusion is a deep bruise on your rib area. Contusions are the result of a blunt trauma that causes bleeding and injury to the tissues under the skin. A rib contusion may involve bruising of the ribs and of the skin and muscles in the area. The skin overlying the contusion may turn blue, purple, or yellow. Minor injuries will give you a painless contusion, but more severe contusions may stay painful and  swollen for a few weeks. CAUSES  A contusion is usually caused by a blow, trauma, or direct force to an area of the body. This often occurs while playing contact sports. SYMPTOMS  Swelling and redness of the injured area.  Discoloration of the injured area.  Tenderness and soreness of the injured area.  Pain with or without movement. DIAGNOSIS  The diagnosis can be made by taking a medical history and performing a physical exam. An X-ray, CT scan, or MRI may be needed to determine if there were any associated injuries, such as broken bones (fractures) or internal injuries. TREATMENT  Often, the best treatment for a rib contusion is rest. Icing or applying cold compresses to the injured area may help reduce swelling and inflammation. Deep breathing exercises may be recommended to reduce the risk of partial lung collapse and pneumonia. Over-the-counter or prescription medicines may also be recommended for pain control. HOME CARE INSTRUCTIONS   Apply ice to the injured area:  Put ice in a plastic bag.  Place a towel  between your skin and the bag.  Leave the ice on for 20 minutes, 2-3 times per day.  Take medicines only as directed by your health care provider.  Rest the injured area. Avoid strenuous activity and any activities or movements that cause pain. Be careful during activities and avoid bumping the injured area.  Perform deep-breathing exercises as directed by your health care provider.  Do not lift anything that is heavier than 5 lb (2.3 kg) until your health care provider approves.  Do not use any tobacco products, including cigarettes, chewing tobacco, or electronic cigarettes. If you need help quitting, ask your health care provider. SEEK MEDICAL CARE IF:   You have increased bruising or swelling.  You have pain that is not controlled with treatment.  You have a fever. SEEK IMMEDIATE MEDICAL CARE IF:   You have difficulty breathing or shortness of breath.  You develop a continual cough, or you cough up thick or bloody sputum.  You feel sick to your stomach (nauseous), you throw up (vomit), or you have abdominal pain.   This information is not intended to replace advice given to you by your health care provider. Make sure you discuss any questions you have with your health care provider.   Document Released: 10/28/2000 Document Revised: 02/23/2014 Document Reviewed: 11/14/2013 Elsevier Interactive Patient Education [employer]

## 2015-06-21 NOTE — ED Notes (Signed)
Workers compensation was completed.

## 2015-06-21 NOTE — ED Notes (Signed)
Pt reports to ED w/ c/o pain in R side rib cage, back, wrsit and shoulder.  Pt sts that he fell down between "floor joists" yesterday at work.  Denies getting checked out yesterday, pain worse today.  Denies, n/v/d, fever, SOB, head injury, or LOC.

## 2015-07-28 ENCOUNTER — Emergency Department
Admission: EM | Admit: 2015-07-28 | Discharge: 2015-07-29 | Disposition: A | Discharge status: Home / Self Care | Payer: Self-pay | Attending: Emergency Medicine | Admitting: Emergency Medicine

## 2015-07-28 DIAGNOSIS — Z79899 Other long term (current) drug therapy: Secondary | ICD-10-CM | POA: Insufficient documentation

## 2015-07-28 DIAGNOSIS — F1119 Opioid abuse with unspecified opioid-induced disorder: Secondary | ICD-10-CM | POA: Insufficient documentation

## 2015-07-28 DIAGNOSIS — F1193 Opioid use, unspecified with withdrawal: Secondary | ICD-10-CM

## 2015-07-28 DIAGNOSIS — I252 Old myocardial infarction: Secondary | ICD-10-CM | POA: Insufficient documentation

## 2015-07-28 DIAGNOSIS — E785 Hyperlipidemia, unspecified: Secondary | ICD-10-CM | POA: Insufficient documentation

## 2015-07-28 DIAGNOSIS — F1994 Other psychoactive substance use, unspecified with psychoactive substance-induced mood disorder: Secondary | ICD-10-CM

## 2015-07-28 DIAGNOSIS — F1123 Opioid dependence with withdrawal: Secondary | ICD-10-CM

## 2015-07-28 DIAGNOSIS — F17210 Nicotine dependence, cigarettes, uncomplicated: Secondary | ICD-10-CM | POA: Insufficient documentation

## 2015-07-28 DIAGNOSIS — Z7951 Long term (current) use of inhaled steroids: Secondary | ICD-10-CM | POA: Insufficient documentation

## 2015-07-28 DIAGNOSIS — F1910 Other psychoactive substance abuse, uncomplicated: Secondary | ICD-10-CM

## 2015-07-28 DIAGNOSIS — F1120 Opioid dependence, uncomplicated: Secondary | ICD-10-CM

## 2015-07-28 DIAGNOSIS — J449 Chronic obstructive pulmonary disease, unspecified: Secondary | ICD-10-CM | POA: Insufficient documentation

## 2015-07-28 LAB — URINE DRUG SCREEN, QUALITATIVE (ARMC ONLY)
Amphetamines, Ur Screen: NOT DETECTED
BARBITURATES, UR SCREEN: NOT DETECTED
Benzodiazepine, Ur Scrn: NOT DETECTED
COCAINE METABOLITE, UR ~~LOC~~: POSITIVE — AB
Cannabinoid 50 Ng, Ur ~~LOC~~: POSITIVE — AB
MDMA (ECSTASY) UR SCREEN: NOT DETECTED
METHADONE SCREEN, URINE: NOT DETECTED
OPIATE, UR SCREEN: POSITIVE — AB
Phencyclidine (PCP) Ur S: NOT DETECTED
Tricyclic, Ur Screen: POSITIVE — AB

## 2015-07-28 LAB — CBC
HCT: 41 % (ref 40.0–52.0)
HEMOGLOBIN: 14 g/dL (ref 13.0–18.0)
MCH: 31.5 pg (ref 26.0–34.0)
MCHC: 34.2 g/dL (ref 32.0–36.0)
MCV: 92.1 fL (ref 80.0–100.0)
PLATELETS: 220 10*3/uL (ref 150–440)
RBC: 4.45 MIL/uL (ref 4.40–5.90)
RDW: 12.3 % (ref 11.5–14.5)
WBC: 6.8 10*3/uL (ref 3.8–10.6)

## 2015-07-28 LAB — BASIC METABOLIC PANEL
Anion gap: 7 (ref 5–15)
BUN: 24 mg/dL — AB (ref 6–20)
CALCIUM: 9 mg/dL (ref 8.9–10.3)
CO2: 25 mmol/L (ref 22–32)
CREATININE: 1.22 mg/dL (ref 0.61–1.24)
Chloride: 107 mmol/L (ref 101–111)
Glucose, Bld: 114 mg/dL — ABNORMAL HIGH (ref 65–99)
Potassium: 4.2 mmol/L (ref 3.5–5.1)
SODIUM: 139 mmol/L (ref 135–145)

## 2015-07-28 MED ORDER — PROMETHAZINE HCL 25 MG PO TABS
25.0000 mg | ORAL_TABLET | Freq: Once | ORAL | Status: AC
  Administered 2015-07-28: 25 mg via ORAL
  Filled 2015-07-28: qty 1

## 2015-07-28 MED ORDER — DIPHENHYDRAMINE HCL 25 MG PO CAPS
50.0000 mg | ORAL_CAPSULE | ORAL | Status: AC
  Administered 2015-07-28: 50 mg via ORAL
  Filled 2015-07-28: qty 2

## 2015-07-28 MED ORDER — LORAZEPAM 0.5 MG PO TABS
0.5000 mg | ORAL_TABLET | ORAL | Status: AC
  Administered 2015-07-28: 0.5 mg via ORAL
  Filled 2015-07-28: qty 1

## 2015-07-28 MED ORDER — ONDANSETRON 4 MG PO TBDP
4.0000 mg | ORAL_TABLET | Freq: Once | ORAL | Status: AC
  Administered 2015-07-28: 4 mg via ORAL
  Filled 2015-07-28: qty 1

## 2015-07-28 MED ORDER — CLONIDINE HCL 0.1 MG PO TABS: 0.1000 mg | ORAL_TABLET | Freq: Two times a day (BID) | ORAL | Status: DC | PRN

## 2015-07-28 MED ORDER — CLONIDINE HCL 0.1 MG PO TABS
0.1000 mg | ORAL_TABLET | Freq: Once | ORAL | Status: AC
  Administered 2015-07-28: 0.1 mg via ORAL
  Filled 2015-07-28: qty 1

## 2015-07-28 MED ORDER — ONDANSETRON 8 MG PO TBDP
4.0000 mg | ORAL_TABLET | Freq: Three times a day (TID) | ORAL | Status: DC | PRN
  Filled 2015-07-28: qty 0.5

## 2015-07-28 NOTE — ED Notes (Signed)
Pt given supper tray.
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
## 2015-07-28 NOTE — Progress Notes (Signed)
Pt face sheet and H&P fax to [facility] for SA placement.  07/28/2015 Nicole Cian Costanzo, MS, NCC, LPCA Therapeutic Triage Specialist  

## 2015-07-28 NOTE — ED Provider Notes (Signed)
[facility] Emergency Department Provider Note  Time seen: 1:28 PM  I have reviewed the triage vital signs and the nursing notes.   HISTORY  Chief Complaint Addiction Problem    HPI Thomas Dodson is a 55 y.o. male with a past medical history of MI, COPD, who presents the emergency department hoping to detox off heroin and crack cocaine. According to the patient for the past 2 years she has been using heroin. Last used heroin last night. Also use crack cocaine over the past several days. States he smokes cigarettes but denies alcohol use. Patient is very tearful throughout examination saying that he wants to detox off of these drugs. States he has never attempted detox in the past. Denies any medical complaints today.     Past Medical History  Diagnosis Date  . Myocardial infarct (HCC)   . COPD (chronic obstructive pulmonary disease) (HCC)   . MI, old     Patient Active Problem List   Diagnosis Date Noted  . HYPERLIPIDEMIA 11/19/2006  . DEPRESSION 11/19/2006  . MYOCARDIAL INFARCTION, HX OF 11/19/2006  . LOW BACK PAIN 11/19/2006  . BACK PAIN, CHRONIC 11/19/2006  . HEADACHE 11/19/2006  . ALCOHOL ABUSE, HX OF 11/19/2006  . CHICKENPOX, HX OF 11/19/2006    Past Surgical History  Procedure Laterality Date  . Back surgery      Current Outpatient Rx  Name  Route  Sig  Dispense  Refill  . albuterol (PROVENTIL HFA;VENTOLIN HFA) 108 (90 BASE) MCG/ACT inhaler   Inhalation   Inhale 2 puffs into the lungs every 6 (six) hours as needed for wheezing.         . clindamycin (CLEOCIN) 150 MG capsule   Oral   Take 2 capsules (300 mg total) by mouth 3 (three) times daily. May dispense as  capsules   60 capsule   0   . cyclobenzaprine (FLEXERIL) 5 MG tablet   Oral   Take 1 tablet (5 mg total) by mouth every 8 (eight) hours as needed for muscle spasms.   12 tablet   0   . diazepam (VALIUM) 2 MG tablet   Oral   Take 1 tablet (2 mg total) by mouth  every 6 (six) hours as needed for anxiety.   12 tablet   0   . HYDROcodone-acetaminophen (NORCO) 5-325 MG tablet   Oral   Take 1 tablet by mouth every 6 (six) hours as needed for moderate pain.   10 tablet   0   . ketorolac (TORADOL) 10 MG tablet   Oral   Take 1 tablet (10 mg total) by mouth every 8 (eight) hours.   15 tablet   0   . meloxicam (MOBIC) 15 MG tablet   Oral   Take 1 tablet (15 mg total) by mouth daily.   30 tablet   0   . naproxen (EC NAPROSYN) 500 MG EC tablet   Oral   Take 1 tablet (500 mg total) by mouth 2 (two) times daily with a meal.   30 tablet   0   . oxyCODONE-acetaminophen (ROXICET) 5-325 MG per tablet   Oral   Take 1 tablet by mouth every 6 (six) hours as needed.   20 tablet   0   . tiotropium (SPIRIVA HANDIHALER) 18 MCG inhalation capsule   Inhalation   Place 18 mcg into inhaler and inhale daily.           Allergies Cefaclor and Penicillins  No  family history on file.  Social History Social History  Substance Use Topics  . Smoking status: Current Every Day Smoker -- 1.00 packs/day    Types: Cigarettes  . Smokeless tobacco: Not on file  . Alcohol Use: Not on file    Review of Systems Constitutional: Negative for fever. Cardiovascular: Negative for chest pain. Respiratory: Negative for shortness of breath. Gastrointestinal: Negative for abdominal pain. Several episodes of diarrhea today. Genitourinary: Negative for dysuria. Musculoskeletal: Negative for back pain. Neurological: Mild headache. 10-point ROS otherwise negative.  ____________________________________________   PHYSICAL EXAM:  VITAL SIGNS: ED Triage Vitals  Enc Vitals Group     BP 07/28/15 1243 135/94 mmHg     Pulse Rate 07/28/15 1243 95     Resp 07/28/15 1243 20     Temp 07/28/15 1243 98.2 F (36.8 C)     Temp Source 07/28/15 1243 Oral     SpO2 07/28/15 1243 98 %     Weight 07/28/15 1243 120 lb (54.432 kg)     Height 07/28/15 1243 5\' 7"  (1.702 m)      Head Cir --      Peak Flow --      Pain Score 07/28/15 1247 8     Pain Loc --      Pain Edu? --      Excl. in GC? --     Constitutional: Alert and oriented. Well appearing and in no distress. Eyes: Normal exam ENT   Head: Normocephalic and atraumatic.   Mouth/Throat: Mucous membranes are moist. Cardiovascular: Normal rate, regular rhythm. No murmur Respiratory: Normal respiratory effort without tachypnea nor retractions. Breath sounds are clear Gastrointestinal: Soft and nontender. No distention.   Musculoskeletal: Nontender with normal range of motion in all extremities.  Neurologic:  Normal speech and language. No gross focal neurologic deficits Skin:  Skin is warm, dry and intact.  Psychiatric: Mood and affect are normal. Tearful at times when talking about drug use.  ____________________________________________    INITIAL IMPRESSION / ASSESSMENT AND PLAN / ED COURSE  Pertinent labs & imaging results that were available during my care of the patient were reviewed by me and considered in my medical decision making (see chart for details).  Patient presents the emergency department for voluntary opiate detox. Patient has been using heroin for 2 years. Patient is here with his wife for the same. We will have Behavioral Health see the patient to help refer to possible detox facility such's [facility].  ____________________________________________   FINAL CLINICAL IMPRESSION(S) / ED DIAGNOSES  Substance abuse   Minna AntisKevin Lucienne Sawyers, MD 07/28/15 1433

## 2015-07-28 NOTE — ED Provider Notes (Signed)
The patient was told that he did not have the ability to go to detox today, and then started reporting that he was "suicidal". He reports that if he goes home he will kill himself. Based upon this I have placed him under involuntary commitment. We will continue symptomatic management in the ER, and have placed a consultation for psychiatry to see the patient.    Sharyn CreamerMark Quale, MD 07/28/15 [phone]

## 2015-07-28 NOTE — ED Notes (Signed)
Pt updated that [facility] unable to take patient but accepting wife if she'd like to go.  Pt tearful and upset, states "if I go home alone and will commit suicide".  MD notified and orders placed.

## 2015-07-28 NOTE — ED Notes (Addendum)
Pt reports drug addition to heroin and cocaine for past 2 years. Denies alcohol use. Pt reports last using yesterday. Pt reports chills vomiting and diarrhea that has started today. States he does not want to "die" states she wants help. Pt tearful in triage.

## 2015-07-28 NOTE — ED Notes (Signed)
Report received from Nolon BussingSteven Jones, RN.

## 2015-07-28 NOTE — ED Notes (Signed)
Patient wanded and valuables searched by [employer] Foodsfficer Sparks.  Pipe secured at this time.  With permission from patient Pipe discarded of in sharps container, witnessed by Dorinda Hillonald, Programmer, multimediaN and Officer Sparks.

## 2015-07-28 NOTE — ED Notes (Signed)
Pt given graham crackers and sprite. Pt ambulated to bathroom.

## 2015-07-29 DIAGNOSIS — F1123 Opioid dependence with withdrawal: Secondary | ICD-10-CM

## 2015-07-29 DIAGNOSIS — F1193 Opioid use, unspecified with withdrawal: Secondary | ICD-10-CM

## 2015-07-29 DIAGNOSIS — F1994 Other psychoactive substance use, unspecified with psychoactive substance-induced mood disorder: Secondary | ICD-10-CM

## 2015-07-29 DIAGNOSIS — F1120 Opioid dependence, uncomplicated: Secondary | ICD-10-CM

## 2015-07-29 MED ORDER — TRAZODONE HCL 50 MG PO TABS
50.0000 mg | ORAL_TABLET | Freq: Once | ORAL | Status: AC
  Administered 2015-07-29: 50 mg via ORAL
  Filled 2015-07-29: qty 1

## 2015-07-29 MED ORDER — TRAZODONE HCL 50 MG PO TABS
ORAL_TABLET | ORAL | Status: AC
  Administered 2015-07-29: 50 mg via ORAL
  Filled 2015-07-29: qty 1

## 2015-07-29 NOTE — ED Notes (Signed)
Given a back to work slip

## 2015-07-29 NOTE — ED Notes (Signed)
Pt is aware that he is waiting for psych md

## 2015-07-29 NOTE — ED Notes (Signed)
waitng for  psych consult

## 2015-07-29 NOTE — ED Notes (Signed)
Patient remains calm and cooperative, no issues to report at this time. Patient denies SI, HI.Security cameras and Q15 minutes monitoring continues. 

## 2015-07-29 NOTE — Discharge Instructions (Signed)
Polysubstance Abuse °When people abuse more than one drug or type of drug it is called polysubstance or polydrug abuse. For example, many smokers also drink alcohol. This is one form of polydrug abuse. Polydrug abuse also refers to the use of a drug to counteract an unpleasant effect produced by another drug. It may also be used to help with withdrawal from another drug. People who take stimulants may become agitated. Sometimes this agitation is countered with a tranquilizer. This helps protect against the unpleasant side effects. Polydrug abuse also refers to the use of different drugs at the same time.  °Anytime drug use is interfering with normal living activities, it has become abuse. This includes problems with family and friends. Psychological dependence has developed when your mind tells you that the drug is needed. This is usually followed by physical dependence which has developed when continuing increases of drug are required to get the same feeling or "high". This is known as addiction or chemical dependency. A person's risk is much higher if there is a history of chemical dependency in the family. °SIGNS OF CHEMICAL DEPENDENCY °· You have been told by friends or family that drugs have become a problem. °· You fight when using drugs. °· You are having blackouts (not remembering what you do while using). °· You feel sick from using drugs but continue using. °· You lie about use or amounts of drugs (chemicals) used. °· You need chemicals to get you going. °· You are suffering in work performance or in school because of drug use. °· You get sick from use of drugs but continue to use anyway. °· You need drugs to relate to people or feel comfortable in social situations. °· You use drugs to forget problems. °"Yes" answered to any of the above signs of chemical dependency indicates there are problems. The longer the use of drugs continues, the greater the problems will become. °If there is a family history of  drug or alcohol use, it is best not to experiment with these drugs. Continual use leads to tolerance. After tolerance develops more of the drug is needed to get the same feeling. This is followed by addiction. With addiction, drugs become the most important part of life. It becomes more important to take drugs than participate in the other usual activities of life. This includes relating to friends and family. Addiction is followed by dependency. Dependency is a condition where drugs are now needed not just to get high, but to feel normal. °Addiction cannot be cured but it can be stopped. This often requires outside help and the care of professionals. Treatment centers are listed in the yellow pages under: Cocaine, Narcotics, and Alcoholics Anonymous. Most hospitals and clinics can refer you to a specialized care center. Talk to your caregiver if you need help. °  °This information is not intended to replace advice given to you by your health care provider. Make sure you discuss any questions you have with your health care provider. °  °Document Released: 09/24/2004 Document Revised: 04/27/2011 Document Reviewed: 02/07/2014 °Elsevier Interactive Patient Education ©2016 [employer] ° °

## 2015-07-29 NOTE — ED Notes (Signed)
Patient complaining of not being able to sleep and reports taking Trazodone at home up to 150mg  at night . RN spoke with Dr. Zenda AlpersWebster and Trazodone 50mg  administered at 0135. Patient tolerated medication well.Patient currently in room lying in bed at this time.Security cameras and Q15 minutes monitoring continues.

## 2015-07-29 NOTE — ED Provider Notes (Signed)
-----------------------------------------   8:27 AM on 07/29/2015 -----------------------------------------   Blood pressure 107/75, pulse 78, temperature 97.9 F (36.6 C), temperature source Oral, resp. rate 18, height 5\' 7"  (1.702 m), weight 120 lb (54.432 kg), SpO2 100 %.  The patient had no acute events since last update.  Calm and cooperative at this time.   Patient awaiting M.D. evaluation     Rebecka ApleyAllison P Samone Guhl, MD 07/29/15 [phone]

## 2015-07-29 NOTE — ED Notes (Signed)
[facility] without difficulty, to room . Patient is calm, alert, oriented and in no apparent distress. Patient denies SI, HI, and AVH. Patient made aware of security cameras and Q15 minute rounds. Patient encouraged to let Nursing staff know of any concerns or needs. Patient given snack tray without utensils and water at this time.

## 2015-07-29 NOTE — Consult Note (Signed)
[facility] Psychiatry Consult   Reason for Consult:  Consult for 55 year old man with history of opiate dependence came to the emergency room requesting detox Referring Physician:  Quale Patient Identification: Thomas Dodson MRN:  [id number] Principal Diagnosis: Substance induced mood disorder [facility]) Diagnosis:   Patient Active Problem List   Diagnosis Date Noted  . Substance induced mood disorder ([place]) [F19.94] 07/29/2015  . Opiate withdrawal ([place]) [F11.23] 07/29/2015  . Opiate dependence ([place]) [F11.20] 07/29/2015  . HYPERLIPIDEMIA [E78.5] 11/19/2006  . DEPRESSION [F32.9] 11/19/2006  . MYOCARDIAL INFARCTION, HX OF [I25.2] 11/19/2006  . LOW BACK PAIN [M54.5] 11/19/2006  . BACK PAIN, CHRONIC [M54.9] 11/19/2006  . HEADACHE [R51] 11/19/2006  . ALCOHOL ABUSE, HX OF [F10.21] 11/19/2006  . CHICKENPOX, HX OF [Z91.89] 11/19/2006    Total Time spent with patient: 1 hour  Subjective:   Thomas Dodson is a 55 y.o. male patient admitted with "I don't want to die, I just want help".  HPI:  Patient interviewed. Chart reviewed. 55 year old man came to the emergency room over the weekend along with his wife. Both were requesting treatment for heroin dependence. Our TS would only take one member of a married couple and the wife was sent to our TS. Patient still in the emergency room. Patient states that he has been using heroin regularly for a couple years. Also admits he is used cocaine intermittently most recently in the last couple days. He says he and his wife got sick of their heroin addiction and decided to come in for detox. It's now been a little over a day since his last use. He is having aches and pains nausea and jitteriness and generally feeling "dope sick". Patient reports his mood as being bad and anxious. He denies having any suicidal thoughts at all. Denies any wish to die. Denies any thought of wanting to hurt anyone else. Not having any hallucinations.  Medical history: History of  chronic back pain of a myocardial infarction of elevated cholesterol  Social history: Patient and his wife live together in [place]. He works as a Games developer.  Substance abuse history: Long history of substance abuse previously alcohol but has not been drinking in years. Last 2-3 years has been using opiates with the last couple years dominated by heroin use. Has been to substance abuse treatment in the past.  Past Psychiatric History: Doesn't sound like he's had any psychiatric treatment that wasn't for drug abuse. Denies any history of suicide attempts or violence. No history of psychosis  Risk to Self: Suicidal Ideation: Yes-Currently Present (Passive) Suicidal Intent: No Is patient at risk for suicide?: Yes Suicidal Plan?: No Access to Means: No What has been your use of drugs/alcohol within the last 12 months?: Heroin  How many times?: 0 Other Self Harm Risks: Excessive drug use  Triggers for Past Attempts: Other (Comment) (N/A) Intentional Self Injurious Behavior: None Risk to Others: Homicidal Ideation: No Thoughts of Harm to Others: No Current Homicidal Intent: No Current Homicidal Plan: No Access to Homicidal Means: No Identified Victim: N/A History of harm to others?: No Assessment of Violence: None Noted Violent Behavior Description: N/A Does patient have access to weapons?: No Criminal Charges Pending?: No Does patient have a court date: No Prior Inpatient Therapy: Prior Inpatient Therapy: No Prior Therapy Dates: N/A Prior Therapy Facilty/Provider(s): N/A Reason for Treatment: N/A Prior Outpatient Therapy: Prior Outpatient Therapy: No Prior Therapy Dates: N/A Prior Therapy Facilty/Provider(s): N/A Reason for Treatment: N/A Does patient have an ACCT team?: No  Does patient have Intensive In-House Services?  : No Does patient have [facility] services? : No Does patient have P4CC services?: No  Past Medical History:  Past Medical History  Diagnosis Date  .  Myocardial infarct ([place])   . COPD (chronic obstructive pulmonary disease) ([place])   . MI, old     Past Surgical History  Procedure Laterality Date  . Back surgery     Family History: No family history on file. Family Psychiatric  History: Positive for alcohol abuse and multiple members of the family Social History:  History  Alcohol Use: Not on file     History  Drug Use Not on file    Social History   Social History  . Marital Status: Married    Spouse Name: N/A  . Number of Children: N/A  . Years of Education: N/A   Social History Main Topics  . Smoking status: Current Every Day Smoker -- 1.00 packs/day    Types: Cigarettes  . Smokeless tobacco: Not on file  . Alcohol Use: Not on file  . Drug Use: Not on file  . Sexual Activity: Not on file   Other Topics Concern  . Not on file   Social History Narrative   Additional Social History:    Allergies:   Allergies  Allergen Reactions  . Cefaclor Hives and Swelling  . Penicillins Hives, Swelling and Other (See Comments)    Has patient had a PCN reaction causing immediate rash, facial/tongue/throat swelling, SOB or lightheadedness with hypotension:   Has patient had a PCN reaction causing severe rash involving mucus membranes or skin necrosis:   Has patient had a PCN reaction that required hospitalization   Has patient had a PCN reaction occurring within the last 10 years:  If all of the above answers are "NO", then may proceed with Cephalosporin use.      Labs:  Results for orders placed or performed during the hospital encounter of 07/28/15 (from the past 48 hour(s))  Urine Drug Screen, Qualitative ([place] only)     Status: Abnormal   Collection Time: 07/28/15  6:51 PM  Result Value Ref Range   Tricyclic, Ur Screen POSITIVE (A) NONE DETECTED   Amphetamines, Ur Screen NONE DETECTED NONE DETECTED   MDMA (Ecstasy)Ur Screen NONE DETECTED NONE DETECTED   Cocaine Metabolite,Ur [place] POSITIVE (A) NONE DETECTED   Opiate,  Ur Screen POSITIVE (A) NONE DETECTED   Phencyclidine (PCP) Ur S NONE DETECTED NONE DETECTED   Cannabinoid 50 Ng, Ur [place] POSITIVE (A) NONE DETECTED   Barbiturates, Ur Screen NONE DETECTED NONE DETECTED   Benzodiazepine, Ur Scrn NONE DETECTED NONE DETECTED   Methadone Scn, Ur NONE DETECTED NONE DETECTED    Comment: (NOTE) 572  Tricyclics, urine               Cutoff 1000 ng/mL 200  Amphetamines, urine             Cutoff 1000 ng/mL 300  MDMA (Ecstasy), urine           Cutoff 500 ng/mL 400  Cocaine Metabolite, urine       Cutoff 300 ng/mL 500  Opiate, urine                   Cutoff 300 ng/mL 600  Phencyclidine (PCP), urine      Cutoff 25 ng/mL 700  Cannabinoid, urine              Cutoff 50 ng/mL 800  Barbiturates, urine  Cutoff 200 ng/mL 900  Benzodiazepine, urine           Cutoff 200 ng/mL 1000 Methadone, urine                Cutoff 300 ng/mL 1100 1200 The urine drug screen provides only a preliminary, unconfirmed 1300 analytical test result and should not be used for non-medical 1400 purposes. Clinical consideration and professional judgment should 1500 be applied to any positive drug screen result due to possible 1600 interfering substances. A more specific alternate chemical method 1700 must be used in order to obtain a confirmed analytical result.  1800 Gas chromato graphy / mass spectrometry (GC/MS) is the preferred 1900 confirmatory method.   CBC     Status: None   Collection Time: 07/28/15  9:28 PM  Result Value Ref Range   WBC 6.8 3.8 - 10.6 K/uL   RBC 4.45 4.40 - 5.90 MIL/uL   Hemoglobin 14.0 13.0 - 18.0 g/dL   HCT 41.0 40.0 - 52.0 %   MCV 92.1 80.0 - 100.0 fL   MCH 31.5 26.0 - 34.0 pg   MCHC 34.2 32.0 - 36.0 g/dL   RDW 12.3 11.5 - 14.5 %   Platelets 220 150 - 440 K/uL  Basic metabolic panel     Status: Abnormal   Collection Time: 07/28/15  9:28 PM  Result Value Ref Range   Sodium 139 135 - 145 mmol/L   Potassium 4.2 3.5 - 5.1 mmol/L   Chloride 107 101 -  111 mmol/L   CO2 25 22 - 32 mmol/L   Glucose, Bld 114 (H) 65 - 99 mg/dL   BUN 24 (H) 6 - 20 mg/dL   Creatinine, Ser 1.22 0.61 - 1.24 mg/dL   Calcium 9.0 8.9 - 10.3 mg/dL   GFR calc non Af Amer >60 >60 mL/min   GFR calc Af Amer >60 >60 mL/min    Comment: (NOTE) The eGFR has been calculated using the CKD EPI equation. This calculation has not been validated in all clinical situations. eGFR's persistently <60 mL/min signify possible Chronic Kidney Disease.    Anion gap 7 5 - 15    Current Facility-Administered Medications  Medication Dose Route Frequency Provider Last Rate Last Dose  . cloNIDine (CATAPRES) tablet 0.1 mg  0.1 mg Oral BID PRN Delman Kitten, MD      . ondansetron (ZOFRAN-ODT) disintegrating tablet 4 mg  4 mg Oral Q8H PRN Delman Kitten, MD       Current Outpatient Prescriptions  Medication Sig Dispense Refill  . albuterol (PROVENTIL HFA;VENTOLIN HFA) 108 (90 BASE) MCG/ACT inhaler Inhale 2 puffs into the lungs every 6 (six) hours as needed for wheezing.    . baclofen (LIORESAL) 10 MG tablet Take 1 tablet by mouth 3 (three) times daily.  0  . cyclobenzaprine (FLEXERIL) 5 MG tablet Take 1 tablet (5 mg total) by mouth every 8 (eight) hours as needed for muscle spasms. 12 tablet 0  . meloxicam (MOBIC) 15 MG tablet Take 1 tablet by mouth daily.  11  . methocarbamol (ROBAXIN) 500 MG tablet Take 1-2 tablets by mouth every 6 (six) hours.  0  . naproxen (EC NAPROSYN) 500 MG EC tablet Take 1 tablet (500 mg total) by mouth 2 (two) times daily with a meal. 30 tablet 0  . tiotropium (SPIRIVA HANDIHALER) 18 MCG inhalation capsule Place 18 mcg into inhaler and inhale daily.    . traMADol (ULTRAM) 50 MG tablet Take 50 mg by mouth every 6 (six)  hours as needed. for pain  0    Musculoskeletal: Strength & Muscle Tone: within normal limits Gait & Station: normal Patient leans: N/A  Psychiatric Specialty Exam: Physical Exam  Nursing note and vitals reviewed. Constitutional: He appears  well-developed and well-nourished.  HENT:  Head: Normocephalic and atraumatic.  Eyes: Conjunctivae are normal. Pupils are equal, round, and reactive to light.  Neck: Normal range of motion.  Cardiovascular: Regular rhythm and normal heart sounds.   Respiratory: Effort normal. No respiratory distress.  GI: Soft.  Musculoskeletal: Normal range of motion.  Neurological: He is alert.  Skin: Skin is warm and dry.  Psychiatric: His speech is normal. His affect is labile. He is agitated. Thought content is not delusional. Cognition and memory are normal. He expresses impulsivity. He exhibits a depressed mood. He expresses no homicidal and no suicidal ideation.    Review of Systems  Constitutional: Negative.   HENT: Negative.   Eyes: Negative.   Respiratory: Negative.   Cardiovascular: Negative.   Gastrointestinal: Positive for nausea and abdominal pain.  Musculoskeletal: Positive for myalgias.  Skin: Negative.   Neurological: Negative.   Psychiatric/Behavioral: Positive for substance abuse. Negative for depression, suicidal ideas, hallucinations and memory loss. The patient is nervous/anxious and has insomnia.     Blood pressure 107/75, pulse 78, temperature 97.9 F (36.6 C), temperature source Oral, resp. rate 18, height '5\' 7"'  (1.702 m), weight 54.432 kg (120 lb), SpO2 100 %.Body mass index is 18.79 kg/(m^2).  General Appearance: Disheveled  Eye Contact:  Minimal  Speech:  Pressured  Volume:  Decreased  Mood:  Depressed  Affect:  Constricted  Thought Process:  Goal Directed  Orientation:  Full (Time, Place, and Person)  Thought Content:  Logical  Suicidal Thoughts:  No  Homicidal Thoughts:  No  Memory:  Immediate;   Good Recent;   Fair Remote;   Fair  Judgement:  Intact  Insight:  Fair  Psychomotor Activity:  Decreased  Concentration:  Concentration: Fair  Recall:  [employer] of Knowledge:  Fair  Language:  Fair  Akathisia:  No  Handed:  Right  AIMS (if indicated):      Assets:  Communication Skills Desire for Improvement Housing Physical Health Resilience  ADL's:  Intact  Cognition:  WNL  Sleep:        Treatment Plan Summary: Plan 55 year old man with a history of opiate abuse currently going through typical symptoms of opiate withdrawal. Mood is sad and anxious and a little agitated around his uncomfortable physical feelings. No evidence of psychosis. Absolutely denies any suicidal thoughts at all. Patient does not require involuntary commitment and this will be discontinued. I have offered him the option of looking into a in observation bed in [place]. Patient understands nature of this and says that he would like to do that. I have asked TTS to look into whether he would be eligible. Be taken off commitment. If we can't get him in observation bed we will reconsider a discharge plan.  Disposition: Patient does not meet criteria for psychiatric inpatient admission. Supportive therapy provided about ongoing stressors.  Alethia Berthold, MD 07/29/2015 12:51 PM

## 2015-07-29 NOTE — ED Notes (Signed)
Pt to be dc home he refused to wait until he could see if a [facility] was [facility]

## 2016-05-14 IMAGING — DX DG LUMBAR SPINE 2-3V
3 series · 3 of 3 positions shown · non-contrast
Comparison: 09/10/2013

CLINICAL DATA: Low back pain and history of prior lumbar fusion.

EXAM:
LUMBAR SPINE - 2-3 VIEW

[l-spine ap]
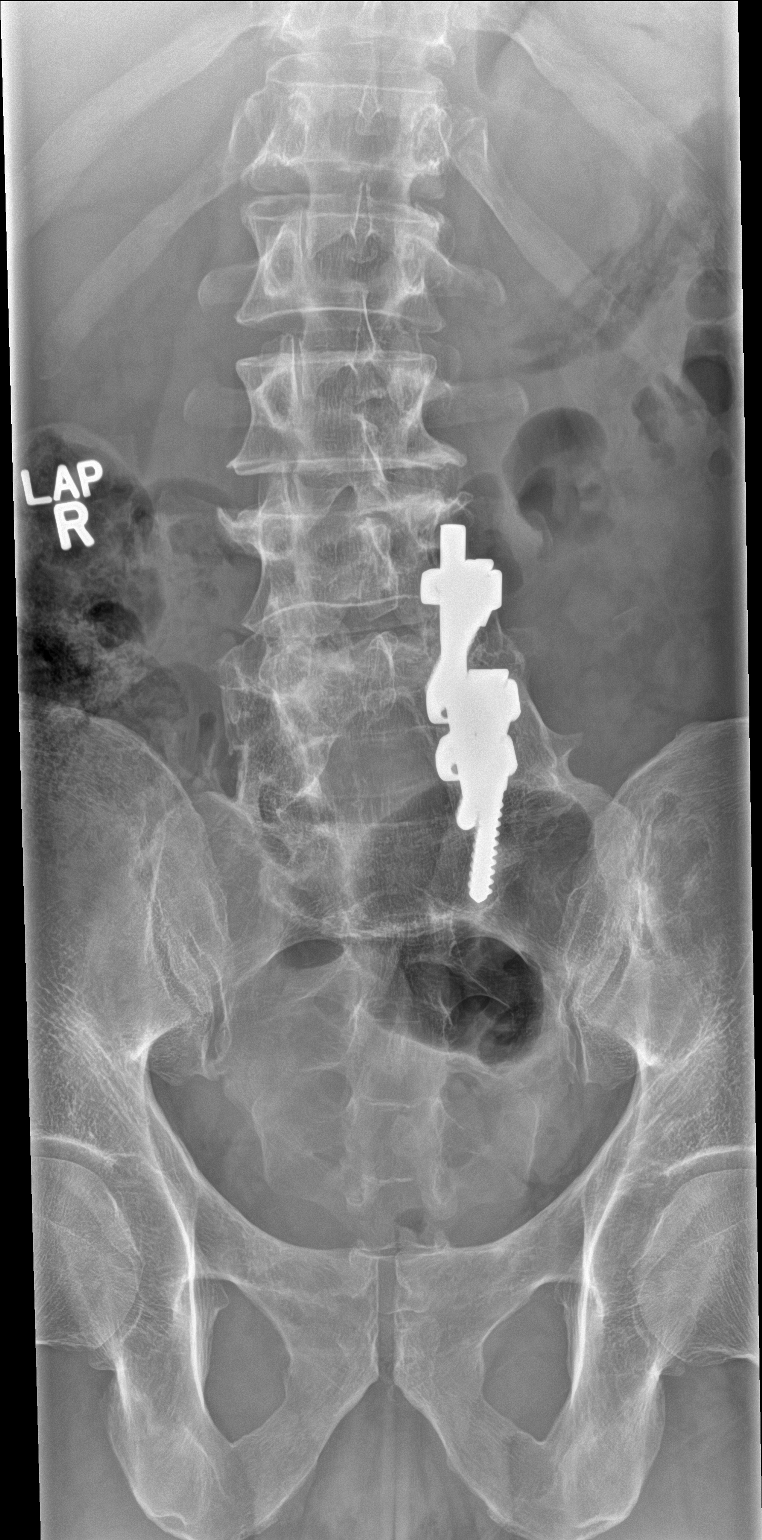

[l-spine lat]
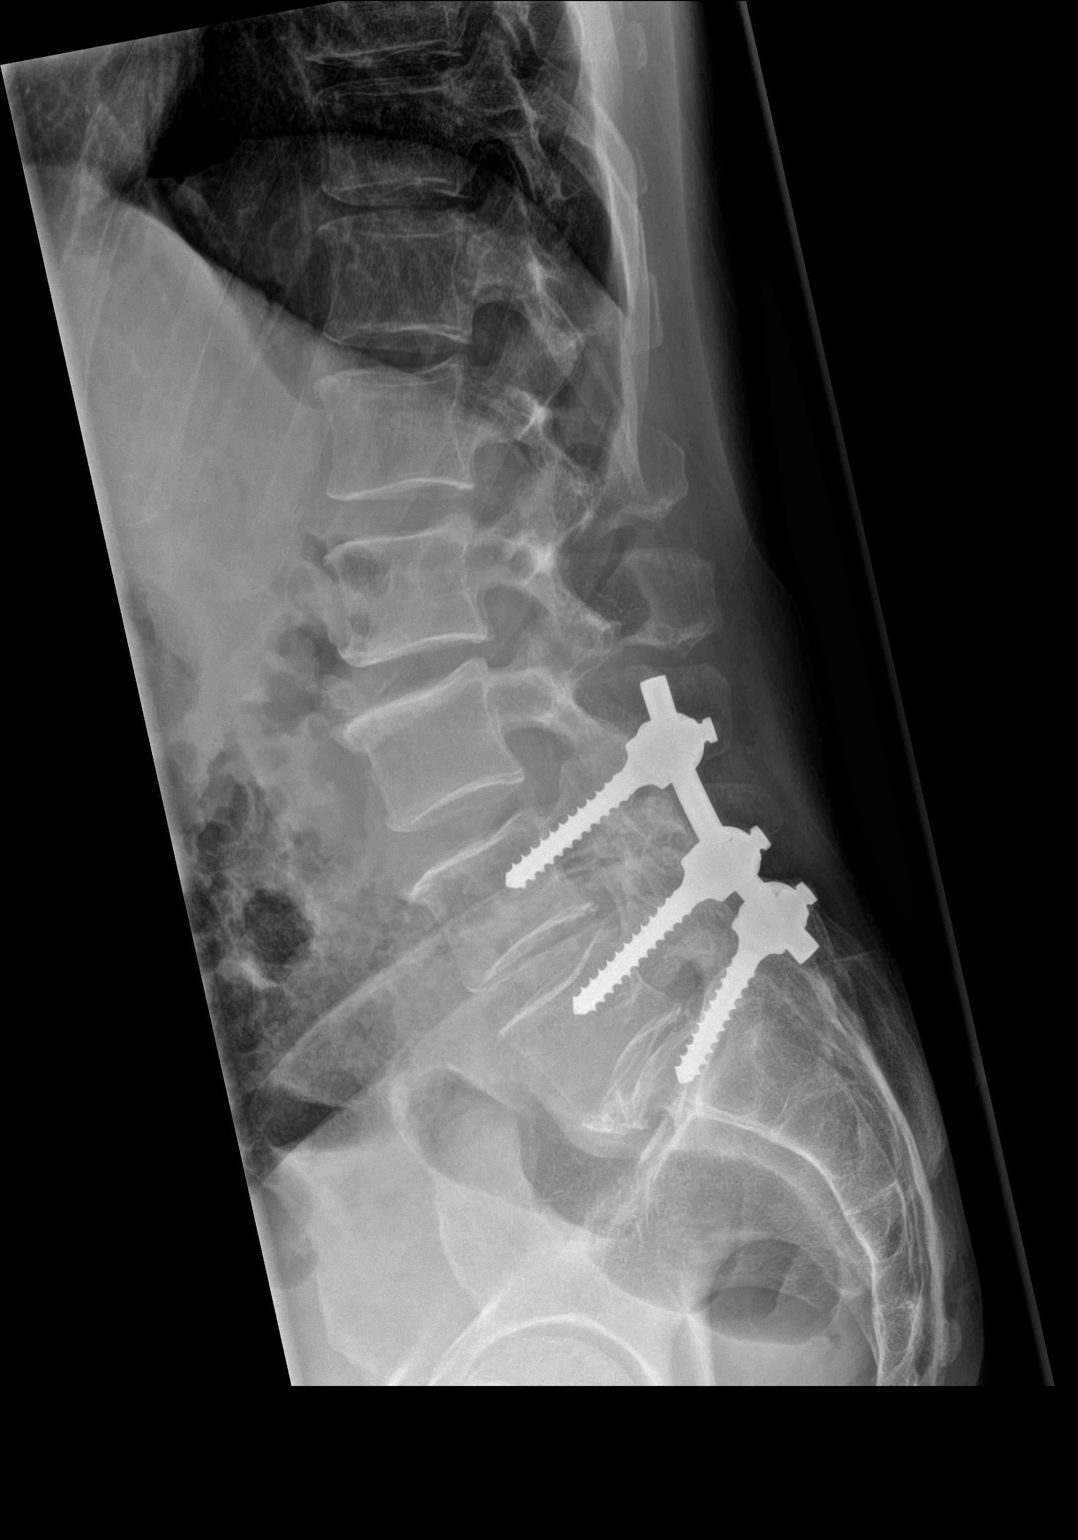

[l-spine spot]
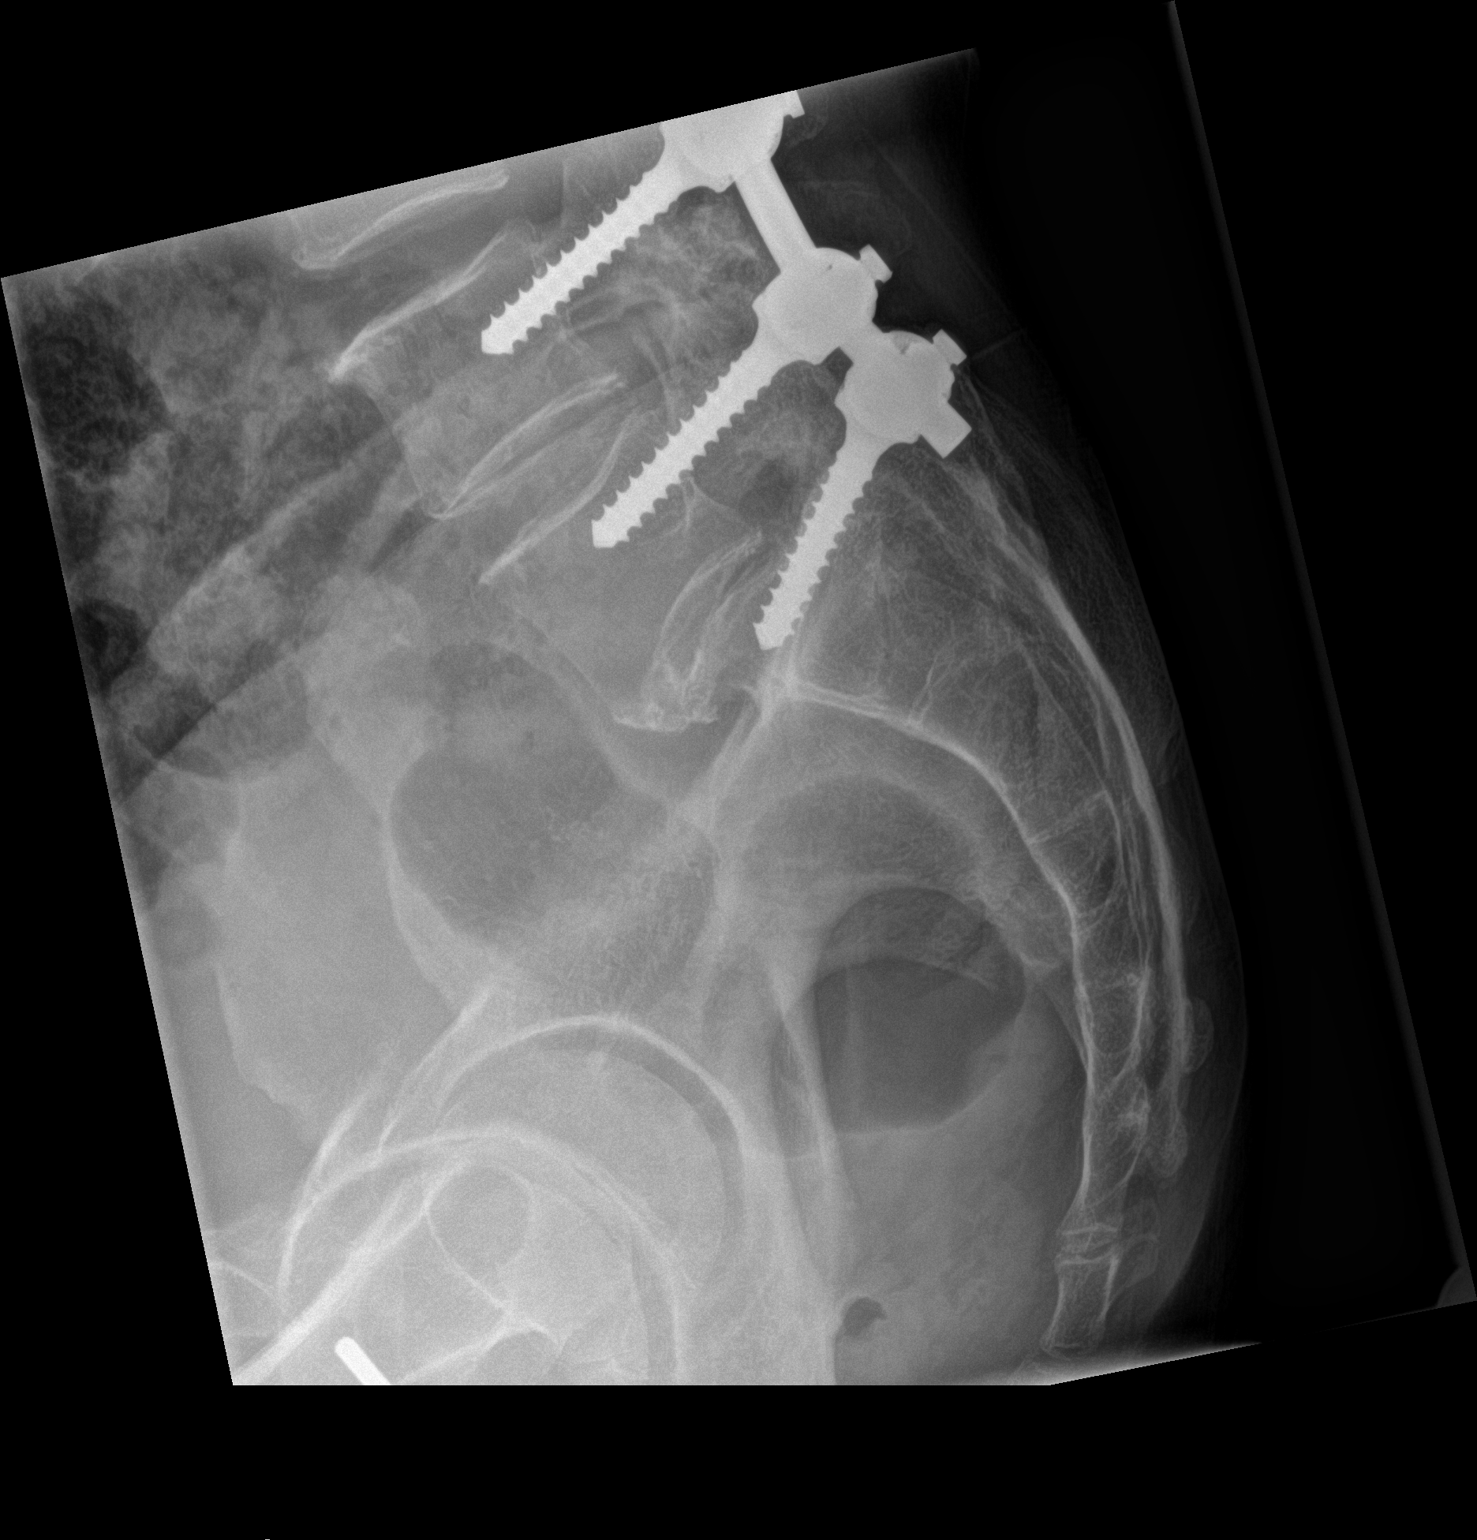

[3 of 3 positions shown; findings below may reference images not displayed]

FINDINGS: Stable appearance status post left-sided artery fusion with
unilateral transpedicular screws identified at the L4, L5 and S1
levels. No evidence of hardware failure or bony fracture. Stable
disc space narrowing at L5-S1. Stable mild chronic anterolisthesis
of L5 relative to S1.
IMPRESSION: No acute findings. Stable appearance of unilateral left-sided fusion
at the L4, L5 and S1 levels with stable spondylosis and listhesis at
L5-S1.

## 2016-05-14 IMAGING — MR MR LUMBAR SPINE W/O CM
5 series · 39 of 48 positions shown · non-contrast
Comparison: Radiography 07/23/2014. Radiography 09/10/2013.
Radiography 07/05/2009.

CLINICAL DATA: Personal history of previous lumbosacral fusion
3772. Sudden worsening of pain 2 days ago in the low back and both
hips.

EXAM:
MRI LUMBAR SPINE WITHOUT CONTRAST
TECHNIQUE: Multiplanar, multisequence MR imaging of the lumbar spine was
performed. No intravenous contrast was administered.

[Series 2: T2 · sagittal · 4.0mm · 1.02mm/px · 5 of 15 slices shown (1 of 2)]
[im 1/15]
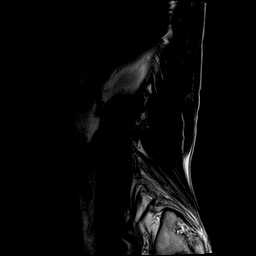
[im 4/15]
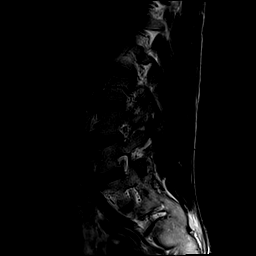
[im 8/15]
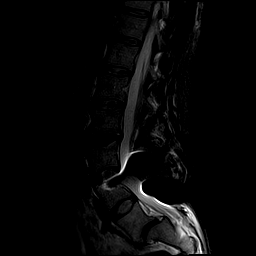
[im 11/15]
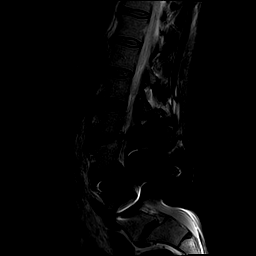
[im 15/15]
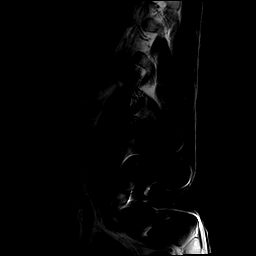

[Series 3: T1 · sagittal · 4.0mm · 1.02mm/px · 5 of 15 slices shown (1 of 2)]
[im 1/15]
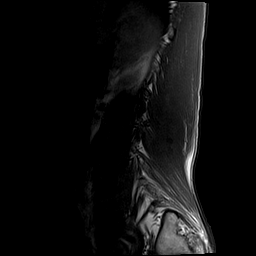
[im 4/15]
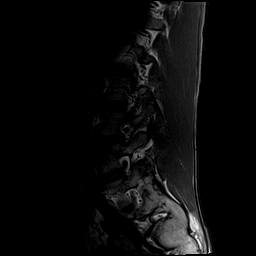
[im 8/15]
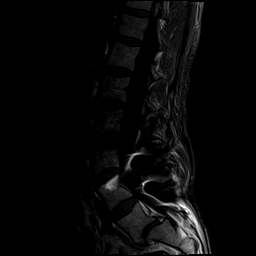
[im 11/15]
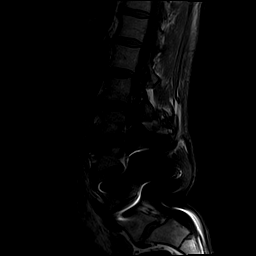
[im 15/15]
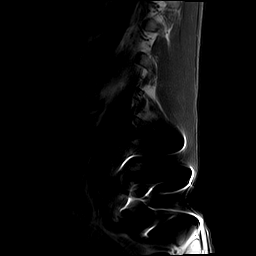

[Series 4: STIR · sagittal · 4.0mm · 1.02mm/px · 6 of 15 slices shown]
[im 1/15]
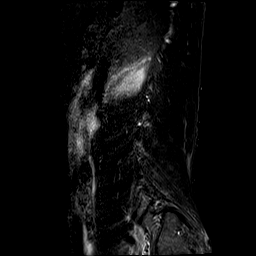
[im 3/15]
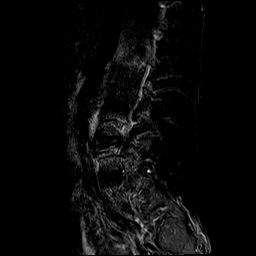
[im 6/15]
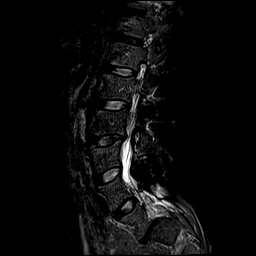
[im 9/15]
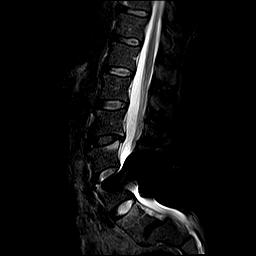
[im 12/15]
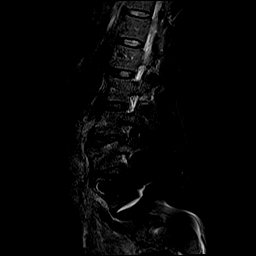
[im 15/15]
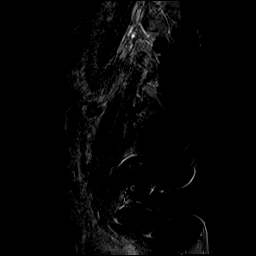

[Series 5: T2 · axial · 4.0mm · 0.78mm/px · z∈[-112,+133]mm · 13 of 42 slices shown (2 of 2)]
[im 1/42]
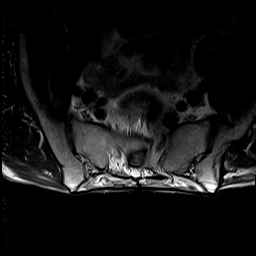
[im 3/42]
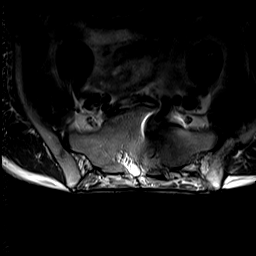
[im 6/42]
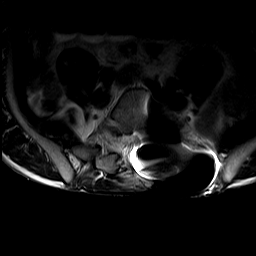
[im 9/42]
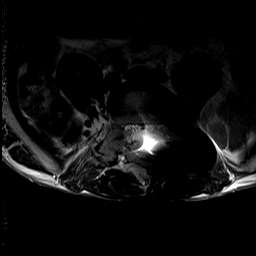
[im 11/42]
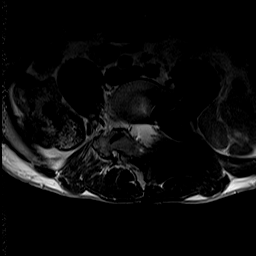
[im 14/42]
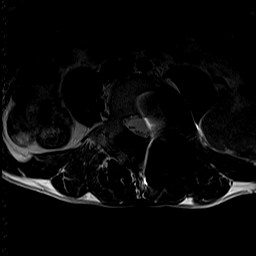
[im 17/42]
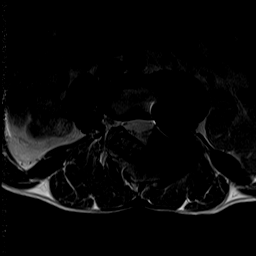
[im 20/42]
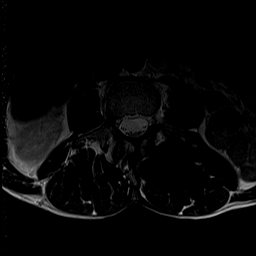
[im 22/42]
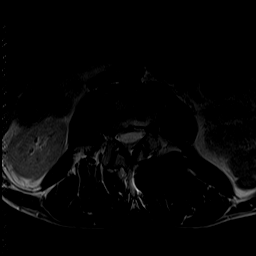
[im 25/42]
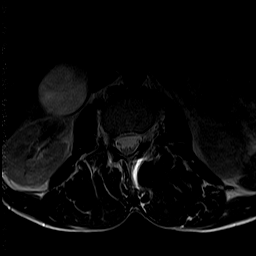
[im 31/42]
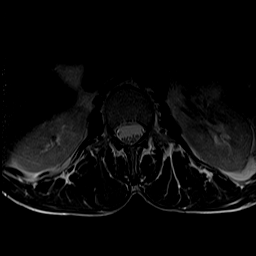
[im 36/42]
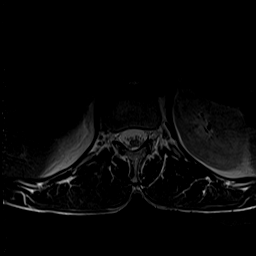
[im 42/42]
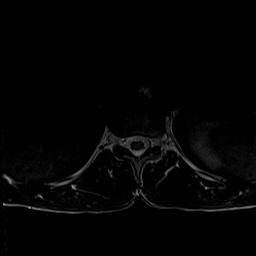

[Series 6: T1 · axial · 4.0mm · 0.39mm/px · z∈[-103,+133]mm · 10 of 42 slices shown (2 of 2)]
[im 3/42]
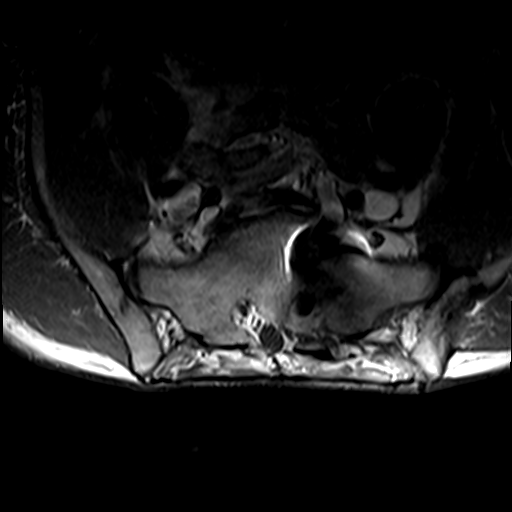
[im 6/42]
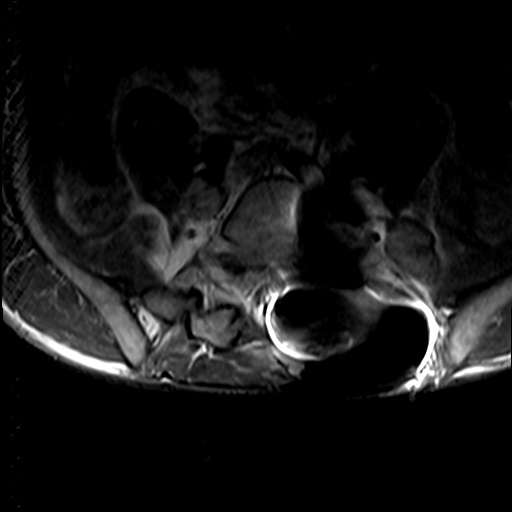
[im 9/42]
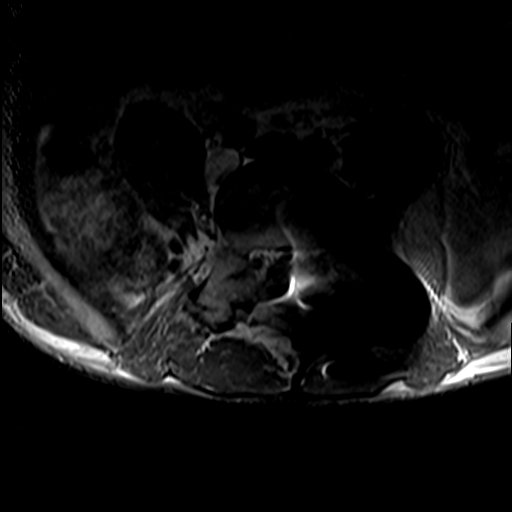
[im 14/42]
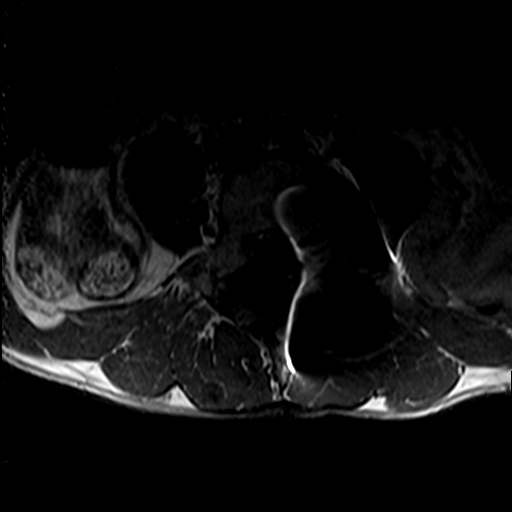
[im 20/42]
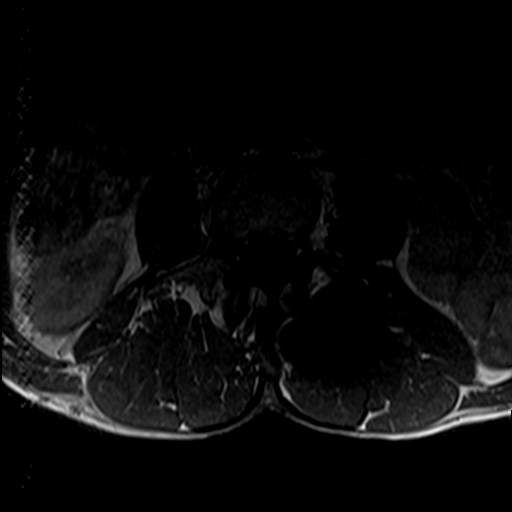
[im 22/42]
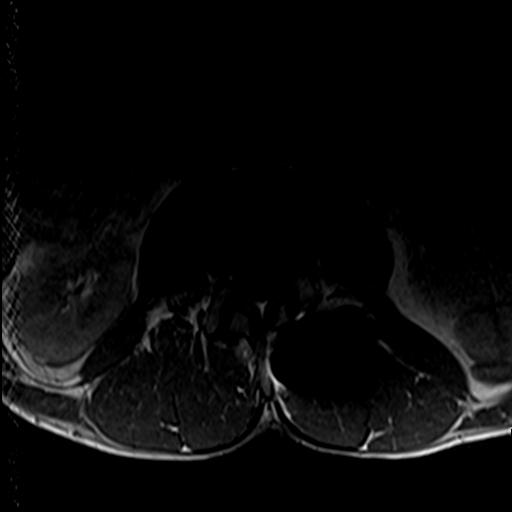
[im 25/42]
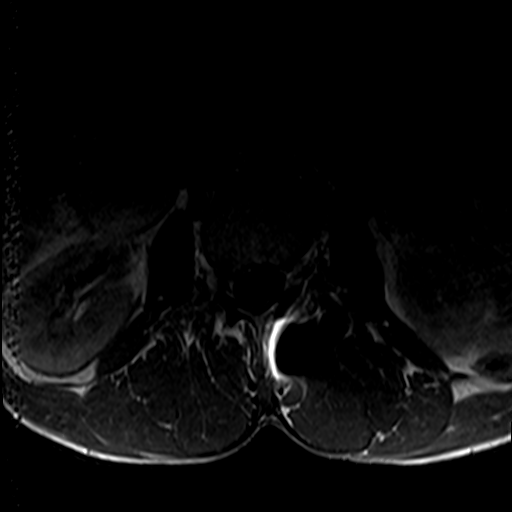
[im 31/42]
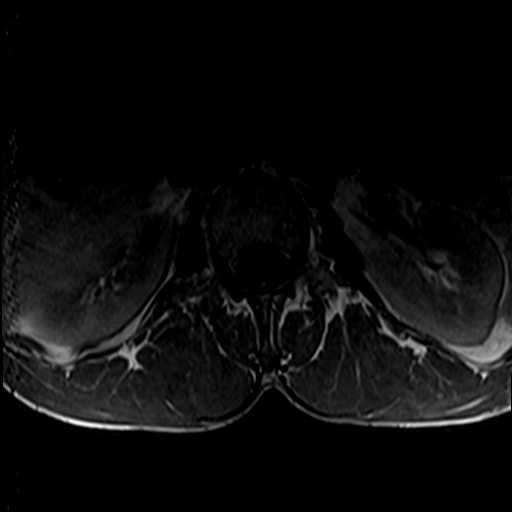
[im 36/42]
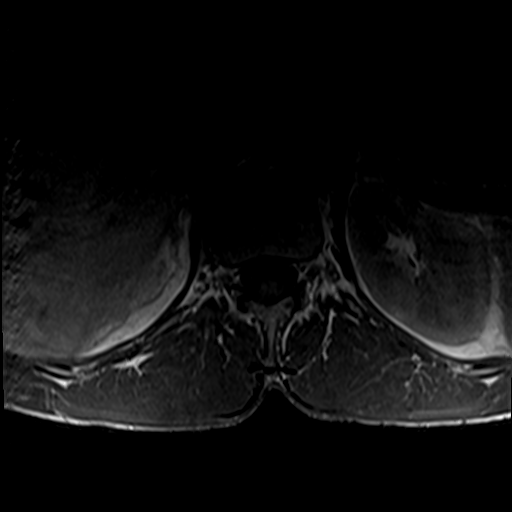
[im 42/42]
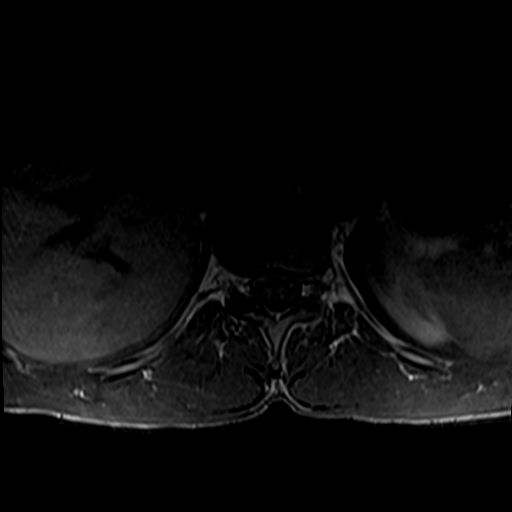

[39 of 48 positions shown; findings below may reference images not displayed]

FINDINGS: There is minimal curvature convex to the right. There is no
abnormality at T11-12, T12-L1 or L1-2. The distal cord and conus are
normal with the conus tip at mid L1.

L2-3: Retrolisthesis of 2 mm. Desiccation and bulging of the disc.
No compressive narrowing of the canal or foramina.

L3-4: Artifact on the left related to fusion hardware. No suspicion
of degenerative disc disease or stenosis.

L4 to S1. Distant fusion with left-sided pedicle screws and
posterior rod. Artifact on the left because of that. No abnormality
of the L4-5 disc. Chronic fixed anterolisthesis at L5-S1 measuring 7
mm. No apparent stenosis of the canal. Foramen on the left poorly
seen because of the artifact.
IMPRESSION: No change or complicating feature is identified. No explanation for
development of acute pain.

Previous fusion from L4 to the sacrum with left-sided pedicle screws
and posterior rod. Fusion appears solid. Wide patency of the canal.
No change in chronic anterolisthesis of L5 relative to the sacrum of
7 mm. Foramen on the left at L5-S1 cannot be evaluated because of
artifact from fusion hardware.

Chronic degenerative change at L2-3 with retrolisthesis of 2 mm and
mild bulging of the disc but no apparent compressive stenosis.

## 2018-04-12 ENCOUNTER — Emergency Department: Payer: Self-pay

## 2018-04-12 ENCOUNTER — Encounter: Payer: Self-pay | Admitting: Emergency Medicine

## 2018-04-12 ENCOUNTER — Other Ambulatory Visit: Payer: Self-pay

## 2018-04-12 ENCOUNTER — Emergency Department
Admission: EM | Admit: 2018-04-12 | Discharge: 2018-04-12 | Disposition: A | Discharge status: Home / Self Care | Payer: Self-pay | Attending: Dermatology | Admitting: Dermatology

## 2018-04-12 DIAGNOSIS — Y9289 Other specified places as the place of occurrence of the external cause: Secondary | ICD-10-CM | POA: Insufficient documentation

## 2018-04-12 DIAGNOSIS — Y998 Other external cause status: Secondary | ICD-10-CM | POA: Insufficient documentation

## 2018-04-12 DIAGNOSIS — I252 Old myocardial infarction: Secondary | ICD-10-CM | POA: Insufficient documentation

## 2018-04-12 DIAGNOSIS — W312XXA Contact with powered woodworking and forming machines, initial encounter: Secondary | ICD-10-CM | POA: Insufficient documentation

## 2018-04-12 DIAGNOSIS — F17210 Nicotine dependence, cigarettes, uncomplicated: Secondary | ICD-10-CM | POA: Insufficient documentation

## 2018-04-12 DIAGNOSIS — S62632B Displaced fracture of distal phalanx of right middle finger, initial encounter for open fracture: Secondary | ICD-10-CM | POA: Insufficient documentation

## 2018-04-12 DIAGNOSIS — S62639B Displaced fracture of distal phalanx of unspecified finger, initial encounter for open fracture: Secondary | ICD-10-CM

## 2018-04-12 DIAGNOSIS — S61212A Laceration without foreign body of right middle finger without damage to nail, initial encounter: Secondary | ICD-10-CM | POA: Insufficient documentation

## 2018-04-12 DIAGNOSIS — J449 Chronic obstructive pulmonary disease, unspecified: Secondary | ICD-10-CM | POA: Insufficient documentation

## 2018-04-12 DIAGNOSIS — Z23 Encounter for immunization: Secondary | ICD-10-CM | POA: Insufficient documentation

## 2018-04-12 DIAGNOSIS — Y9389 Activity, other specified: Secondary | ICD-10-CM | POA: Insufficient documentation

## 2018-04-12 MED ORDER — BACITRACIN-NEOMYCIN-POLYMYXIN 400-5-5000 EX OINT: TOPICAL_OINTMENT | Freq: Once | CUTANEOUS | Status: DC

## 2018-04-12 MED ORDER — LIDOCAINE HCL (PF) 1 % IJ SOLN
5.0000 mL | Freq: Once | INTRAMUSCULAR | Status: AC
  Administered 2018-04-12: 5 mL via INTRADERMAL

## 2018-04-12 MED ORDER — DOXYCYCLINE HYCLATE 100 MG PO CAPS: 100.0000 mg | ORAL_CAPSULE | Freq: Two times a day (BID) | ORAL | 0 refills | Status: DC

## 2018-04-12 MED ORDER — LIDOCAINE HCL (PF) 1 % IJ SOLN
5.0000 mL | Freq: Once | INTRAMUSCULAR | Status: DC
  Filled 2018-04-12: qty 5

## 2018-04-12 MED ORDER — BUPIVACAINE HCL (PF) 0.5 % IJ SOLN
10.0000 mL | Freq: Once | INTRAMUSCULAR | Status: AC
  Administered 2018-04-12: 10 mL
  Filled 2018-04-12: qty 10

## 2018-04-12 MED ORDER — TETANUS-DIPHTH-ACELL PERTUSSIS 5-2.5-18.5 LF-MCG/0.5 IM SUSP
0.5000 mL | Freq: Once | INTRAMUSCULAR | Status: AC
Start: 2018-04-12 — End: 2018-04-12
  Administered 2018-04-12: 0.5 mL via INTRAMUSCULAR
  Filled 2018-04-12: qty 0.5

## 2018-04-12 MED ORDER — NAPROXEN 500 MG PO TABS: 500.0000 mg | ORAL_TABLET | Freq: Two times a day (BID) | ORAL | 0 refills | Status: DC

## 2018-04-12 NOTE — ED Provider Notes (Signed)
[facility] Emergency Department Provider Note  ____________________________________________  Time seen: Approximately 3:09 PM  I have reviewed the triage vital signs and the nursing notes.   HISTORY  Chief Complaint Finger Injury   HPI KORBAN SHEARER is a 58 y.o. male who presents to the emergency department for treatment and evaluation after cutting his finger with a table saw. Incident occurred at about 12:30 this afternoon. Tdap is not up to date. He is right hand dominant. No alleviating measures prior to arrival.   Past Medical History:  Diagnosis Date  . COPD (chronic obstructive pulmonary disease) ([facility])   . MI, old   . Myocardial infarct [facility])     Patient Active Problem List   Diagnosis Date Noted  . Substance induced mood disorder ([facility]) 07/29/2015  . Opiate withdrawal ([facility]) 07/29/2015  . Opiate dependence ([facility]) 07/29/2015  . HYPERLIPIDEMIA 11/19/2006  . DEPRESSION 11/19/2006  . MYOCARDIAL INFARCTION, HX OF 11/19/2006  . LOW BACK PAIN 11/19/2006  . BACK PAIN, CHRONIC 11/19/2006  . HEADACHE 11/19/2006  . ALCOHOL ABUSE, HX OF 11/19/2006  . CHICKENPOX, HX OF 11/19/2006    Past Surgical History:  Procedure Laterality Date  . BACK SURGERY      Prior to Admission medications   Medication Sig Start Date End Date Taking? Authorizing Provider  albuterol (PROVENTIL HFA;VENTOLIN HFA) 108 (90 BASE) MCG/ACT inhaler Inhale 2 puffs into the lungs every 6 (six) hours as needed for wheezing.    [provider]  doxycycline (VIBRAMYCIN) 100 MG capsule Take 1 capsule (100 mg total) by mouth 2 (two) times daily. 04/12/18   Sia Gabrielsen, Rulon Eisenmenger B, FNP  naproxen (NAPROSYN) 500 MG tablet Take 1 tablet (500 mg total) by mouth 2 (two) times daily with a meal. 04/12/18   Tonilynn Bieker B, FNP  tiotropium (SPIRIVA HANDIHALER) 18 MCG inhalation capsule Place 18 mcg into inhaler and inhale daily.    [provider]    Allergies Cefaclor and  Penicillins  No family history on file.  Social History Social History   Tobacco Use  . Smoking status: Current Every Day Smoker    Packs/day: 1.00    Types: Cigarettes  . Smokeless tobacco: Never Used  Substance Use Topics  . Alcohol use: Not Currently  . Drug use: Not Currently    Review of Systems  Constitutional: Negative for fever. Respiratory: Negative for cough or shortness of breath.  Musculoskeletal: Positive for right long finger injury. Skin: Positive for laceration Neurological: Negative for numbness or paresthesias. ____________________________________________   PHYSICAL EXAM:  VITAL SIGNS: ED Triage Vitals  Enc Vitals Group     BP 04/12/18 1410 (!) 147/97     Pulse Rate 04/12/18 1410 74     Resp 04/12/18 1410 18     Temp 04/12/18 1410 98.7 F (37.1 C)     Temp Source 04/12/18 1410 Oral     SpO2 04/12/18 1410 97 %     Weight 04/12/18 1411 140 lb (63.5 kg)     Height 04/12/18 1411  (1.702 m)     Head Circumference --      Peak Flow --      Pain Score 04/12/18 1410 8     Pain Loc --      Pain Edu? --      Excl. in GC? --      Constitutional: Well appearing. Eyes: Conjunctivae are clear without discharge or drainage. Nose: No rhinorrhea noted. Mouth/Throat: Airway is patent.  Neck: No  stridor. Unrestricted range of motion observed. Cardiovascular: Capillary refill is <3 seconds.  Respiratory: Respirations are even and unlabored.. Musculoskeletal: Unrestricted range of motion observed. Neurologic: Awake, alert, and oriented x 4.  Skin: Skin flap of the right long finger.  ____________________________________________   LABS (all labs ordered are listed, but only abnormal results are displayed)  Labs Reviewed - No data to display ____________________________________________  EKG  Not indicated. ____________________________________________  RADIOLOGY  Right long finger image shows distal tuft  fragment. ____________________________________________   PROCEDURES  .Marland KitchenLaceration Repair Date/Time: 04/12/2018 4:34 PM Performed by: Chinita Pester, FNP Authorized by: Chinita Pester, FNP   Consent:    Consent obtained:  Verbal   Consent given by:  Patient   Risks discussed:  Infection, poor cosmetic result and poor wound healing Anesthesia (see MAR for exact dosages):    Anesthesia method:  Nerve block   Block needle gauge:  25 G   Block anesthetic:  Bupivacaine 0.5% w/o epi and lidocaine 1% w/o epi   Block technique:  Digital   Block injection procedure:  Introduced needle   Block outcome:  Anesthesia achieved Laceration details:    Location:  Finger   Finger location:  R long finger   Length (cm):  2.5 Repair type:    Repair type:  Simple Pre-procedure details:    Preparation:  Patient was prepped and draped in usual sterile fashion Exploration:    Hemostasis achieved with:  Direct pressure   Contaminated: no   Treatment:    Area cleansed with:  Betadine and saline   Amount of cleaning:  Standard   Irrigation solution:  Sterile saline   Irrigation method:  Syringe Skin repair:    Repair method:  Sutures and tissue adhesive   Suture size:  5-0   Suture material:  Nylon   Suture technique:  Simple interrupted   Number of sutures:  4 Approximation:    Approximation:  Close Post-procedure details:    Dressing:  Antibiotic ointment, sterile dressing and splint for protection   Patient tolerance of procedure:  Tolerated well, no immediate complications Comments:     Dermabond used to realign skin flap of the right side cuticle edge.   ____________________________________________   INITIAL IMPRESSION / ASSESSMENT AND PLAN / ED COURSE  IBRAHAM NEDD is a 58 y.o. male who presents to the emergency department for treatment and evaluation of right middle finger injury. X-ray shows distal tuft fracture associated with laceration from table saw injury.    Procedure performed as above.  Patient tolerated well.  Because he is allergic to cephalosporins, he will be placed on doxycycline.  Based on Up To Date, it does not appear that there is need for dual coverage as this is considered a Gustilio- Dareen Piano type II fracture.  Patient was given strict ER return precautions for any concern or sign of infection.  He was given a referral to see Dr. Stephenie Acres, our hand specialist.  Tetanus vaccination was also updated today.  Aluminum foam splint was used for protection of the wound.  He is to keep the bandage placed today on for the next 48 hours then he is to remove it and apply a fresh bandage.  He was encouraged to have the sutures removed in about 10 to 12 days.  Patient and wife verbalized understanding of instructions.  Medications  Tdap (BOOSTRIX) injection 0.5 mL (0.5 mLs Intramuscular Given 04/12/18 1618)  bupivacaine (MARCAINE) 0.5 % injection 10 mL (10 mLs Infiltration Given 04/12/18  1616)  lidocaine (PF) (XYLOCAINE) 1 % injection 5 mL (5 mLs Intradermal Given 04/12/18 1616)     Pertinent labs & imaging results that were available during my care of the patient were reviewed by me and considered in my medical decision making (see chart for details).  ____________________________________________   FINAL CLINICAL IMPRESSION(S) / ED DIAGNOSES  Final diagnoses:  Open fracture of tuft of distal phalanx of finger    ED Discharge Orders         Ordered    doxycycline (VIBRAMYCIN) 100 MG capsule  2 times daily     04/12/18 1631    naproxen (NAPROSYN) 500 MG tablet  2 times daily with meals     04/12/18 1631           Note:  This document was prepared using Dragon voice recognition software and may include unintentional dictation errors.    Chinita Pester, FNP 04/12/18 1642    Jeanmarie Plant, MD 04/12/18 2040

## 2018-04-12 NOTE — Discharge Instructions (Signed)
Return to the ER for any concern of infection.  Call to schedule an appointment with the specialist.  Leave the bandage in place for 48 hours, then apply a new one.  Do not soak your hand in any water until sutures are removed.

## 2018-04-12 NOTE — ED Triage Notes (Signed)
Pt presents to ED via POV with c/o R middle finger injury, pt states cut his finger with a table saw. Bleeding controlled in triage. Pt with wound to end of middle finger.

## 2018-04-12 NOTE — ED Notes (Signed)
See triage note states he was using a table saw  Laceration noted right middle finger

## 2018-10-19 ENCOUNTER — Other Ambulatory Visit: Payer: Self-pay

## 2018-10-19 ENCOUNTER — Ambulatory Visit: Payer: Self-pay | Admitting: Family Medicine

## 2019-05-14 ENCOUNTER — Ambulatory Visit: Payer: Self-pay | Attending: Internal Medicine

## 2019-05-14 DIAGNOSIS — Z23 Encounter for immunization: Secondary | ICD-10-CM

## 2019-05-14 NOTE — Progress Notes (Signed)
Covid-19 Vaccination Clinic  Name:  Thomas Dodson    MRN: [id number] DOB: September 05, 1960  05/14/2019  Mr. [name] was observed post Covid-19 immunization for 15 minutes without incident. He was provided with Vaccine Information Sheet and instruction to access the V-Safe system.   Mr. [name] was instructed to call 911 with any severe reactions post vaccine: Marland Kitchen Difficulty breathing  . Swelling of face and throat  . A fast heartbeat  . A bad rash all over body  . Dizziness and weakness   Immunizations Administered    Name Date Dose VIS Date Route   Pfizer COVID-19 Vaccine 05/14/2019  1:58 PM 0.3 mL 01/27/2019 Intramuscular   Manufacturer: [employer], [employer]   Lot: [id number]   NDC: [address]-4

## 2019-05-16 ENCOUNTER — Ambulatory Visit: Payer: Self-pay

## 2019-06-06 ENCOUNTER — Ambulatory Visit: Payer: Self-pay | Attending: Internal Medicine

## 2019-06-06 DIAGNOSIS — Z23 Encounter for immunization: Secondary | ICD-10-CM

## 2019-06-06 NOTE — Progress Notes (Signed)
Covid-19 Vaccination Clinic  Name:  Thomas Dodson    MRN: [id number] DOB: [birth year]  06/06/2019  [name] was observed post Covid-19 immunization for 15 minutes without incident. He was provided with Vaccine Information Sheet and instruction to access the V-Safe system.   [name] was instructed to call 911 with any severe reactions post vaccine: Marland Kitchen Difficulty breathing  . Swelling of face and throat  . A fast heartbeat  . A bad rash all over body  . Dizziness and weakness   Immunizations Administered    Name Date Dose VIS Date Route   Pfizer COVID-19 Vaccine 06/06/2019  2:28 PM 0.3 mL 04/12/2018 Intramuscular   Manufacturer: [employer], [employer]   Lot: [id number]   NDC: [address]-2

## 2022-10-02 ENCOUNTER — Telehealth: Payer: Self-pay | Admitting: Nurse Practitioner

## 2022-10-02 DIAGNOSIS — L42 Pityriasis rosea: Secondary | ICD-10-CM

## 2022-10-02 MED ORDER — PREDNISONE 10 MG PO TABS: ORAL_TABLET | ORAL | 0 refills | Status: DC

## 2022-10-02 NOTE — Progress Notes (Signed)
Virtual Visit Consent   Thomas Dodson, you are scheduled for a virtual visit with a [facility] provider today. Just as with appointments in the office, your consent must be obtained to participate. Your consent will be active for this visit and any virtual visit you may have with one of our providers in the next 365 days. If you have a MyChart account, a copy of this consent can be sent to you electronically.  As this is a virtual visit, video technology does not allow for your provider to perform a traditional examination. This may limit your provider's ability to fully assess your condition. If your provider identifies any concerns that need to be evaluated in person or the need to arrange testing (such as labs, EKG, etc.), we will make arrangements to do so. Although advances in technology are sophisticated, we cannot ensure that it will always work on either your end or our end. If the connection with a video visit is poor, the visit may have to be switched to a telephone visit. With either a video or telephone visit, we are not always able to ensure that we have a secure connection.  By engaging in this virtual visit, you consent to the provision of healthcare and authorize for your insurance to be billed (if applicable) for the services provided during this visit. Depending on your insurance coverage, you may receive a charge related to this service.  I need to obtain your verbal consent now. Are you willing to proceed with your visit today? Thomas Dodson has provided verbal consent on 10/02/2022 for a virtual visit (video or telephone). Viviano Simas, FNP  Date: 10/02/2022 4:14 PM  Virtual Visit via Video Note   I, Viviano Simas, connected with  Thomas Dodson  ([id number], [birth year]) on 10/02/22 at  4:15 PM EDT by a video-enabled telemedicine application and verified that I am speaking with the correct person using two identifiers.  Location: Patient: Virtual Visit Location Patient:  Home Provider: Virtual Visit Location Provider: Home Office   I discussed the limitations of evaluation and management by telemedicine and the availability of in person appointments. The patient expressed understanding and agreed to proceed.    History of Present Illness: Thomas Dodson is a 62 y.o. who identifies as a male who was assigned male at birth, and is being seen today for a rash.   Symptom onset was 8-10 days ago   The rash covers 80% of his body does not include face/hands or feet   The rash is itchy   He has tried Benadryl lotion and oral unsure if he has had any relief   Denies changing any soaps/detergents  Denies new medications   Seemed to appear all at one time and has not spread   Denies recent illnesses  Shares a bed with his wife she does not have a rash   Does have dogs and cats without symptoms    Problems:  Patient Active Problem List   Diagnosis Date Noted   Substance induced mood disorder (HCC) 07/29/2015   Opiate withdrawal (HCC) 07/29/2015   Opiate dependence (HCC) 07/29/2015   HYPERLIPIDEMIA 11/19/2006   DEPRESSION 11/19/2006   MYOCARDIAL INFARCTION, HX OF 11/19/2006   LOW BACK PAIN 11/19/2006   BACK PAIN, CHRONIC 11/19/2006   HEADACHE 11/19/2006   ALCOHOL ABUSE, HX OF 11/19/2006   CHICKENPOX, HX OF 11/19/2006      Medications:  Current Outpatient Medications:    albuterol (PROVENTIL HFA;VENTOLIN HFA) 108 (90  BASE) MCG/ACT inhaler, Inhale 2 puffs into the lungs every 6 (six) hours as needed for wheezing., Disp: , Rfl:    doxycycline (VIBRAMYCIN) 100 MG capsule, Take 1 capsule (100 mg total) by mouth 2 (two) times daily., Disp: 20 capsule, Rfl: 0   naproxen (NAPROSYN) 500 MG tablet, Take 1 tablet (500 mg total) by mouth 2 (two) times daily with a meal., Disp: 30 tablet, Rfl: 0   tiotropium (SPIRIVA HANDIHALER) 18 MCG inhalation capsule, Place 18 mcg into inhaler and inhale daily., Disp: , Rfl:   Observations/Objective: Patient is  well-developed, well-nourished in no acute distress.  Resting comfortably  at home.  Head is normocephalic, atraumatic.  No labored breathing.  Speech is clear and coherent with logical content.  Patient is alert and oriented at baseline.  Scattered rash throughout trunk abdomen and back without any break in skin  Rash is isolated to trunk and upper legs   Assessment and Plan:  1. Pityriasis rosea  - predniSONE (DELTASONE) 10 MG tablet; Take 4 tablets (40mg ) on days 1-4, then 3 tablets (30mg ) on days 5-8, then 2 tablets (20mg ) on days 9-11, then 1 tablet daily for days 12-14. Take with food.  Dispense: 37 tablet; Refill: 0     Follow Up Instructions: I discussed the assessment and treatment plan with the patient. The patient was provided an opportunity to ask questions and all were answered. The patient agreed with the plan and demonstrated an understanding of the instructions.  A copy of instructions were sent to the patient via MyChart unless otherwise noted below.    The patient was advised to call back or seek an in-person evaluation if the symptoms worsen or if the condition fails to improve as anticipated.  Time:  I spent 11 minutes with the patient via telehealth technology discussing the above problems/concerns.    Viviano Simas, FNP

## 2022-10-26 ENCOUNTER — Encounter: Payer: Self-pay | Admitting: Emergency Medicine

## 2022-10-26 ENCOUNTER — Emergency Department
Admission: EM | Admit: 2022-10-26 | Discharge: 2022-10-26 | Disposition: A | Discharge status: Home / Self Care | Payer: Self-pay | Attending: Emergency Medicine | Admitting: Emergency Medicine

## 2022-10-26 ENCOUNTER — Other Ambulatory Visit: Payer: Self-pay

## 2022-10-26 DIAGNOSIS — R21 Rash and other nonspecific skin eruption: Secondary | ICD-10-CM | POA: Insufficient documentation

## 2022-10-26 DIAGNOSIS — F439 Reaction to severe stress, unspecified: Secondary | ICD-10-CM | POA: Insufficient documentation

## 2022-10-26 MED ORDER — FAMOTIDINE 20 MG PO TABS
20.0000 mg | ORAL_TABLET | Freq: Once | ORAL | Status: AC
  Administered 2022-10-26: 20 mg via ORAL
  Filled 2022-10-26: qty 1

## 2022-10-26 MED ORDER — PREDNISONE 50 MG PO TABS: ORAL_TABLET | ORAL | 0 refills | Status: DC

## 2022-10-26 MED ORDER — DIPHENHYDRAMINE HCL 25 MG PO CAPS
25.0000 mg | ORAL_CAPSULE | Freq: Once | ORAL | Status: AC
  Administered 2022-10-26: 25 mg via ORAL
  Filled 2022-10-26: qty 1

## 2022-10-26 MED ORDER — DEXAMETHASONE SODIUM PHOSPHATE 10 MG/ML IJ SOLN
10.0000 mg | Freq: Once | INTRAMUSCULAR | Status: AC
  Administered 2022-10-26: 10 mg via INTRAMUSCULAR
  Filled 2022-10-26: qty 1

## 2022-10-26 NOTE — ED Provider Notes (Signed)
[facility] Provider Note    Event Date/Time   First MD Initiated Contact with Patient 10/26/22 1031     (approximate)   History   Rash   HPI  Thomas Dodson is a 62 y.o. male who presents today for evaluation of rash to his torso and extremities, not involving his hands or feet.  Patient reports that this has been ongoing for several weeks.  He denies any fevers or chills.  He reports that he changed detergents recently.  He denies any new medications.  He has not noticed any blistering.  He reports that he has been experiencing a lot of stress recently and was told that he has to move, which is causing him a lot of stress.  He also endorses many other stresses in his life.  He denies being out in the woods or having any tick exposures.  He reports that it is very itchy.  He reports that he has been sleeping in his bed with his wife who does not have the same rash.  He has not slept anywhere else, denies concern for scabies or bedbugs.  He reports that he had a virtual appointment was told that he is stress-induced hives.  Patient Active Problem List   Diagnosis Date Noted   Substance induced mood disorder (HCC) 07/29/2015   Opiate withdrawal (HCC) 07/29/2015   Opiate dependence (HCC) 07/29/2015   HYPERLIPIDEMIA 11/19/2006   DEPRESSION 11/19/2006   MYOCARDIAL INFARCTION, HX OF 11/19/2006   LOW BACK PAIN 11/19/2006   BACK PAIN, CHRONIC 11/19/2006   HEADACHE 11/19/2006   ALCOHOL ABUSE, HX OF 11/19/2006   CHICKENPOX, HX OF 11/19/2006          Physical Exam   Triage Vital Signs: ED Triage Vitals [10/26/22 1023]  Encounter Vitals Group     BP (!) 136/95     Systolic BP Percentile      Diastolic BP Percentile      Pulse Rate 91     Resp 18     Temp 97.7 F (36.5 C)     Temp Source Oral     SpO2 100 %     Weight 139 lb 15.9 oz (63.5 kg)     Height 5\' 7"  (1.702 m)     Head Circumference      Peak Flow      Pain Score 0     Pain Loc      Pain  Education      Exclude from Growth Chart     Most recent vital signs: Vitals:   10/26/22 1023  BP: (!) 136/95  Pulse: 91  Resp: 18  Temp: 97.7 F (36.5 C)  SpO2: 100%    Physical Exam Vitals and nursing note reviewed.  Constitutional:      General: Awake and alert. No acute distress.    Appearance: Normal appearance. The patient is normal weight.  HENT:     Head: Normocephalic and atraumatic.     Mouth: Mucous membranes are moist.  No tongue or lip swelling or intraoral involvement Eyes:     General: PERRL. Normal EOMs        Right eye: No discharge.        Left eye: No discharge.     Conjunctiva/sclera: Conjunctivae normal.  Cardiovascular:     Rate and Rhythm: Normal rate and regular rhythm.     Pulses: Normal pulses.  Pulmonary:     Effort: Pulmonary effort is normal. No  respiratory distress.  No wheezing    Breath sounds: Normal breath sounds.  Abdominal:     Abdomen is soft. There is no abdominal tenderness. No rebound or guarding. No distention. Musculoskeletal:        General: No swelling. Normal range of motion.     Cervical back: Normal range of motion and neck supple.  Skin:    General: Skin is warm and dry.     Capillary Refill: Capillary refill takes less than 2 seconds.     Findings: Erythematous and flaky skin with urticaria to trunk, extremities, and legs.  No edema noted.  No vesicles or bullae.  No skin sloughing.  No linear excoriations.  No involvement of the palms or soles.  No intertriginous/webspace involvement.  No mucosal involvement.  No facial involvement. Neurological:     Mental Status: The patient is awake and alert.      ED Results / Procedures / Treatments   Labs (all labs ordered are listed, but only abnormal results are displayed) Labs Reviewed - No data to display   EKG     RADIOLOGY     PROCEDURES:  Critical Care performed:   Procedures   MEDICATIONS ORDERED IN ED: Medications  dexamethasone (DECADRON)  injection 10 mg (10 mg Intramuscular Given 10/26/22 1047)  famotidine (PEPCID) tablet 20 mg (20 mg Oral Given 10/26/22 1047)  diphenhydrAMINE (BENADRYL) capsule 25 mg (25 mg Oral Given 10/26/22 1047)     IMPRESSION / MDM / ASSESSMENT AND PLAN / ED COURSE  I reviewed the triage vital signs and the nursing notes.   Differential diagnosis includes, but is not limited to, urticaria, eczema, contact dermatitis.  No tongue or lip swelling to suggest anaphylaxis or angioedema.  No new meds, no skin sloughing, no bullae, no mucosal involvement to suggest SJS or TENS.  Denies concern for bedbugs or scabies, does not wish to be treated for this.  No shortness of breath or trouble breathing or swallowing, do not suspect anaphylaxis, and timeline not consistent with this.  Bilateral nature of the rash and lack of vesicles not consistent with shingles.  No involvement of palms or soles, not consistent with syphilis or endocarditis.  No fever or constitutional symptoms to suggest staphylococcal scalded skin syndrome or toxic shock, and patient is hemodynamically stable.  No new medications to suggest dress.  No tick exposures to suggest [facility] spotted fever, and no petechiae noted.  Rash is blanching.  Recommended close outpatient follow-up with dermatology.  He was treated symptomatically.  We discussed the importance of close outpatient follow-up with dermatology for further management and treatment.  Patient understands and agrees with plan.  He was discharged in stable condition.   Patient's presentation is most consistent with acute complicated illness / injury requiring diagnostic workup.   FINAL CLINICAL IMPRESSION(S) / ED DIAGNOSES   Final diagnoses:  Rash     Rx / DC Orders   ED Discharge Orders          Ordered    predniSONE (DELTASONE) 50 MG tablet  Status:  Discontinued        10/26/22 1046    predniSONE (DELTASONE) 50 MG tablet        10/26/22 1056             Note:  This  document was prepared using Dragon voice recognition software and may include unintentional dictation errors.   Keturah Shavers 10/26/22 1228    Pilar Jarvis, MD 10/26/22 [phone]

## 2022-10-26 NOTE — ED Triage Notes (Signed)
Pt here with a rash all over his body for a month but getting worse. Pt states he was dx with stress induced hives and given steroids and and cortisone cream with no relief. Pt also states he has swelling all over his body as well. Pt denies any new medications, foods, or detergents.

## 2022-10-26 NOTE — Group Note (Deleted)

## 2022-10-26 NOTE — Discharge Instructions (Signed)
Please schedule an appointment with a dermatologist.  Please return for any new, worsening, or change in symptoms or other concerns.  It was a pleasure caring for you today.

## 2022-11-11 ENCOUNTER — Emergency Department
Admission: EM | Admit: 2022-11-11 | Discharge: 2022-11-11 | Disposition: A | Discharge status: Home / Self Care | Payer: Self-pay | Attending: Emergency Medicine | Admitting: Emergency Medicine

## 2022-11-11 ENCOUNTER — Other Ambulatory Visit: Payer: Self-pay

## 2022-11-11 DIAGNOSIS — L209 Atopic dermatitis, unspecified: Secondary | ICD-10-CM | POA: Insufficient documentation

## 2022-11-11 DIAGNOSIS — J449 Chronic obstructive pulmonary disease, unspecified: Secondary | ICD-10-CM | POA: Insufficient documentation

## 2022-11-11 MED ORDER — DEXAMETHASONE SODIUM PHOSPHATE 10 MG/ML IJ SOLN
10.0000 mg | Freq: Once | INTRAMUSCULAR | Status: AC
  Administered 2022-11-11: 10 mg via INTRAMUSCULAR
  Filled 2022-11-11: qty 1

## 2022-11-11 MED ORDER — FAMOTIDINE 20 MG PO TABS
20.0000 mg | ORAL_TABLET | Freq: Once | ORAL | Status: AC
  Administered 2022-11-11: 20 mg via ORAL
  Filled 2022-11-11: qty 1

## 2022-11-11 MED ORDER — TRIAMCINOLONE ACETONIDE 0.1 % EX CREA: 1.0000 | TOPICAL_CREAM | Freq: Two times a day (BID) | CUTANEOUS | 0 refills | Status: AC

## 2022-11-11 MED ORDER — FAMOTIDINE 20 MG PO TABS: 20.0000 mg | ORAL_TABLET | Freq: Two times a day (BID) | ORAL | 1 refills | Status: DC

## 2022-11-11 NOTE — ED Provider Notes (Signed)
[facility] Provider Note    Event Date/Time   First MD Initiated Contact with Patient 11/11/22 1147     (approximate)   History   Urticaria (Patient is here for reassessment for rash/urticaria to his torso and extremities (does not extend into mucous membranes); He states that it burns and itches and he is unable to sleep because of his discomfort; Was given referrel for dermatology in Feb but is here today because he is so uncomfortable)   HPI  Thomas Dodson is a 62 y.o. male with PMH of COPD and MI presents for evaluation of a rash.  Patient states the rash has been present for the past 2 months.  He describes it as being incredibly itchy and that it burns when touched.  He denies any fever.  He states it is not on his feet or palms.  Patient states he has been experiencing a significant amount of stress lately due to to changes in his living situation.  He states that the rash began when he was forced to move out of his house.      Physical Exam   Triage Vital Signs: ED Triage Vitals  Encounter Vitals Group     BP 11/11/22 1127 (!) 123/94     Systolic BP Percentile --      Diastolic BP Percentile --      Pulse Rate 11/11/22 1127 91     Resp 11/11/22 1127 18     Temp 11/11/22 1127 98 F (36.7 C)     Temp Source 11/11/22 1127 Oral     SpO2 11/11/22 1127 94 %     Weight 11/11/22 1128 130 lb (59 kg)     Height 11/11/22 1128 5\' 7"  (1.702 m)     Head Circumference --      Peak Flow --      Pain Score 11/11/22 1139 0     Pain Loc --      Pain Education --      Exclude from Growth Chart --     Most recent vital signs: Vitals:   11/11/22 1127  BP: (!) 123/94  Pulse: 91  Resp: 18  Temp: 98 F (36.7 C)  SpO2: 94%    General: Awake, no distress.  CV:  Good peripheral perfusion. Resp:  Normal effort. Abd:  No distention.  Other:  Erythematous lacy and macular rash covering the shins abdomen, chest, back and arms, extending up to patient's  neck and ears with excoriations, crusts and some lichenification.   ED Results / Procedures / Treatments   Labs (all labs ordered are listed, but only abnormal results are displayed) Labs Reviewed - No data to display   PROCEDURES:  Critical Care performed: No  Procedures   MEDICATIONS ORDERED IN ED: Medications  famotidine (PEPCID) tablet 20 mg (20 mg Oral Given 11/11/22 1300)  dexamethasone (DECADRON) injection 10 mg (10 mg Intramuscular Given 11/11/22 1300)     IMPRESSION / MDM / ASSESSMENT AND PLAN / ED COURSE  I reviewed the triage vital signs and the nursing notes.                             62 year old male presents for evaluation of a rash.  Vital signs stable aside from mildly elevated BP.  Patient in significant discomfort on exam, endorses burning pain all over his body.  Differential diagnosis includes, but is not limited to, atopic  dermatitis, urticaria, shingles, SJS.  Patient's presentation is most consistent with acute, uncomplicated illness.  I believe patient's rash is most consistent with widespread eczema.  There is no tongue or lip swelling indicative of angioedema.  Patient does not have any shortness of breath, difficulty breathing or difficulty swallowing so do not suspect anaphylaxis.  Timeline is also not consistent with this.  Patient does not have any skin sloughing or bullae there is no mucosal involvement indicative of SJS.  I will treat patient with topical emollients, famotidine and triamcinolone ointment.  He was given famotidine and dexamethasone to address his itchy symptoms today.  Patient states that he does not have insurance which has made it difficult for him to be seen by dermatologist.  I discussed applying for Medicaid with the patient.  He was provided with information on how to do so.  Patient voiced understanding, all questions were answered and he was stable at discharge.     FINAL CLINICAL IMPRESSION(S) / ED DIAGNOSES   Final  diagnoses:  Atopic dermatitis, unspecified type     Rx / DC Orders   ED Discharge Orders          Ordered    famotidine (PEPCID) 20 MG tablet  2 times daily        11/11/22 1307    triamcinolone cream (KENALOG) 0.1 %  2 times daily        11/11/22 1307             Note:  This document was prepared using Dragon voice recognition software and may include unintentional dictation errors.   Cameron Ali, PA-C 11/11/22 1310    Jene Every, MD 11/12/22 1145

## 2022-11-11 NOTE — Discharge Instructions (Addendum)
Please take the famotidine twice daily as needed for itching.  You can apply the triamcinolone cream to the rash twice daily.  You will also need to get a lotion specifically for eczema, can be bought over-the-counter at any grocery store or pharmacy.  Please apply this twice daily as well.  You will need to follow-up with dermatology.  Please look into signing up for Medicaid as this may improve your options.  You can also follow-[facility] with [facility], information below. This is a free clinic that can help get you set up with a dermatologist.  [facility] [address] [place], [place] [address] [phone]

## 2022-11-11 NOTE — ED Triage Notes (Signed)
Patient is here for reassessment for rash/urticaria to his torso and extremities (does not extend into mucous membranes); He states that it burns and itches and he is unable to sleep because of his discomfort; Was given referrel for dermatology in Feb but is here today because he is so uncomfortable

## 2022-11-23 ENCOUNTER — Emergency Department
Admission: EM | Admit: 2022-11-23 | Discharge: 2022-11-23 | Disposition: A | Discharge status: Home / Self Care | Payer: Self-pay | Attending: Emergency Medicine | Admitting: Emergency Medicine

## 2022-11-23 ENCOUNTER — Other Ambulatory Visit: Payer: Self-pay

## 2022-11-23 DIAGNOSIS — R21 Rash and other nonspecific skin eruption: Secondary | ICD-10-CM | POA: Diagnosis not present

## 2022-11-23 DIAGNOSIS — J449 Chronic obstructive pulmonary disease, unspecified: Secondary | ICD-10-CM | POA: Diagnosis not present

## 2022-11-23 DIAGNOSIS — I2510 Atherosclerotic heart disease of native coronary artery without angina pectoris: Secondary | ICD-10-CM | POA: Insufficient documentation

## 2022-11-23 MED ORDER — METHYLPREDNISOLONE SODIUM SUCC 125 MG IJ SOLR: 125.0000 mg | Freq: Once | INTRAMUSCULAR | Status: DC

## 2022-11-23 MED ORDER — DIPHENHYDRAMINE HCL 50 MG/ML IJ SOLN
25.0000 mg | Freq: Once | INTRAMUSCULAR | Status: AC
  Administered 2022-11-23: 25 mg via INTRAMUSCULAR
  Filled 2022-11-23: qty 1

## 2022-11-23 MED ORDER — HYDROXYZINE PAMOATE 100 MG PO CAPS: 100.0000 mg | ORAL_CAPSULE | Freq: Three times a day (TID) | ORAL | 0 refills | Status: DC | PRN

## 2022-11-23 MED ORDER — PREDNISONE 10 MG (21) PO TBPK: ORAL_TABLET | ORAL | 0 refills | Status: DC

## 2022-11-23 MED ORDER — METHYLPREDNISOLONE SODIUM SUCC 125 MG IJ SOLR
125.0000 mg | Freq: Once | INTRAMUSCULAR | Status: AC
  Administered 2022-11-23: 125 mg via INTRAMUSCULAR
  Filled 2022-11-23: qty 2

## 2022-11-23 NOTE — ED Triage Notes (Signed)
Pt comes with c/o rash all over body. Pt states he has had this for 3 months and has been here multiple times for it. Pt states he was told it was stress hives. Pt has redness, swelling and hives all over. Pt also states knot to back of lower back on left side.

## 2022-11-23 NOTE — ED Provider Notes (Signed)
[facility] Provider Note   Event Date/Time   First MD Initiated Contact with Patient 11/23/22 [phone]     (approximate) History  Rash  HPI Thomas Dodson is a 62 y.o. male with a stated past medical history of COPD and CAD who presents complaining of of whole body rash that has been present over the last 3 months.  Patient states that he has been seen and evaluated here in the emergency department as well as told that he needed to follow-up by dermatology but has been unable to schedule an appointment due to financial constraints.  Patient states that the next appointment he is able to attend with [facility] dermatology is in February.  Patient states that he has had a red, itching/burning whole body rash including on the scalp, face, and only sparing palms and soles of the feet.  Patient states that he has used topical steroids as well as systemic steroid treatment which has been the only thing that has helped him in the past.  Patient states that the itching is 10/10 in severity.  Patient denies any new or changing soaps, detergents, medicines, or foods ROS: Patient currently denies any vision changes, tinnitus, difficulty speaking, facial droop, sore throat, chest pain, shortness of breath, abdominal pain, nausea/vomiting/diarrhea, dysuria, or weakness/numbness/paresthesias in any extremity   Physical Exam  Triage Vital Signs: ED Triage Vitals [11/23/22 0934]  Encounter Vitals Group     BP (!) 144/90     Systolic BP Percentile      Diastolic BP Percentile      Pulse Rate 68     Resp 18     Temp 98 F (36.7 C)     Temp src      SpO2 100 %     Weight      Height      Head Circumference      Peak Flow      Pain Score 10     Pain Loc      Pain Education      Exclude from Growth Chart    Most recent vital signs: Vitals:   11/23/22 0934  BP: (!) 144/90  Pulse: 68  Resp: 18  Temp: 98 F (36.7 C)  SpO2: 100%   General: Awake, oriented x4. CV:  Good peripheral  perfusion.  Resp:  Normal effort.  Abd:  No distention.  Other:  Middle-aged well-developed, well-nourished Caucasian male uncomfortable in bed with raised erythematous plaques over entirety of the body sparing the palms and soles.  No conjunctival injection ED Results / Procedures / Treatments  Labs (all labs ordered are listed, but only abnormal results are displayed) Labs Reviewed - No data to display PROCEDURES: Critical Care performed: No Procedures MEDICATIONS ORDERED IN ED: Medications  methylPREDNISolone sodium succinate (SOLU-MEDROL) 125 mg/2 mL injection 125 mg (has no administration in time range)  diphenhydrAMINE (BENADRYL) injection 25 mg (has no administration in time range)   IMPRESSION / MDM / ASSESSMENT AND PLAN / ED COURSE  I reviewed the triage vital signs and the nursing notes.                             The patient is on the cardiac monitor to evaluate for evidence of arrhythmia and/or significant heart rate changes. Patient's presentation is most consistent with acute presentation with potential threat to life or bodily function. Patient is non-toxic appearing and well hydrated. Ddx: Patient's  symptoms not typical for other emergent causes of rash such as cellulitis, abscess, necrotizing fasciitis, vasculitis, anaphylaxis, SJS or TENS. Disposition: Patient will be discharged with strict return precautions and instructed to follow up with dermatology within 24-48 hours for further evaluation.   FINAL CLINICAL IMPRESSION(S) / ED DIAGNOSES   Final diagnoses:  Rash and nonspecific skin eruption   Rx / DC Orders   ED Discharge Orders          Ordered    predniSONE (STERAPRED UNI-PAK 21 TAB) 10 MG (21) TBPK tablet        11/23/22 1017    hydrOXYzine (VISTARIL) 100 MG capsule  3 times daily PRN        11/23/22 1017           Note:  This document was prepared using Dragon voice recognition software and may include unintentional dictation errors.    Merwyn Katos, MD 11/23/22 1021

## 2022-12-07 ENCOUNTER — Other Ambulatory Visit: Payer: Self-pay

## 2022-12-07 ENCOUNTER — Emergency Department
Admission: EM | Admit: 2022-12-07 | Discharge: 2022-12-07 | Disposition: A | Discharge status: Home / Self Care | Payer: Self-pay | Attending: Emergency Medicine | Admitting: Emergency Medicine

## 2022-12-07 DIAGNOSIS — J449 Chronic obstructive pulmonary disease, unspecified: Secondary | ICD-10-CM | POA: Diagnosis not present

## 2022-12-07 DIAGNOSIS — R21 Rash and other nonspecific skin eruption: Secondary | ICD-10-CM | POA: Insufficient documentation

## 2022-12-07 DIAGNOSIS — I2510 Atherosclerotic heart disease of native coronary artery without angina pectoris: Secondary | ICD-10-CM | POA: Insufficient documentation

## 2022-12-07 MED ORDER — PREDNISONE 10 MG (21) PO TBPK: ORAL_TABLET | ORAL | 0 refills | Status: DC

## 2022-12-07 MED ORDER — HYDROXYZINE HCL 10 MG PO TABS: 10.0000 mg | ORAL_TABLET | Freq: Three times a day (TID) | ORAL | 0 refills | Status: DC | PRN

## 2022-12-07 NOTE — ED Provider Notes (Signed)
[facility] Provider Note    Event Date/Time   First MD Initiated Contact with Patient 12/07/22 1411     (approximate)   History   Rash   HPI  Thomas Dodson is a 62 y.o. male with a past medical history of COPD and CAD who presents today for reevaluation of rash.  Patient has been seen 3 times already for this problem.  He reports that the rash is involving his whole body except for his palms or soles.  He has been using topical steroids and systemic steroids in the past.  Patient Active Problem List   Diagnosis Date Noted   Substance induced mood disorder (HCC) 07/29/2015   Opiate withdrawal (HCC) 07/29/2015   Opiate dependence (HCC) 07/29/2015   HYPERLIPIDEMIA 11/19/2006   DEPRESSION 11/19/2006   MYOCARDIAL INFARCTION, HX OF 11/19/2006   LOW BACK PAIN 11/19/2006   BACK PAIN, CHRONIC 11/19/2006   HEADACHE 11/19/2006   ALCOHOL ABUSE, HX OF 11/19/2006   CHICKENPOX, HX OF 11/19/2006          Physical Exam   Triage Vital Signs: ED Triage Vitals  Encounter Vitals Group     BP 12/07/22 1208 (!) 145/95     Systolic BP Percentile --      Diastolic BP Percentile --      Pulse Rate 12/07/22 1208 65     Resp 12/07/22 1208 18     Temp 12/07/22 1208 98 F (36.7 C)     Temp src --      SpO2 12/07/22 1208 100 %     Weight --      Height --      Head Circumference --      Peak Flow --      Pain Score 12/07/22 1141 8     Pain Loc --      Pain Education --      Exclude from Growth Chart --     Most recent vital signs: Vitals:   12/07/22 1208  BP: (!) 145/95  Pulse: 65  Resp: 18  Temp: 98 F (36.7 C)  SpO2: 100%    Physical Exam Vitals and nursing note reviewed.  Constitutional:      General: Awake and alert. No acute distress.    Appearance: Normal appearance. The patient is normal weight.  HENT:     Head: Normocephalic and atraumatic.     Mouth: Mucous membranes are moist.  No mucosal abnormalities. Eyes:     General: PERRL.  Normal EOMs        Right eye: No discharge.        Left eye: No discharge.     Conjunctiva/sclera: Conjunctivae normal.  Cardiovascular:     Rate and Rhythm: Normal rate and regular rhythm.     Pulses: Normal pulses.  Pulmonary:     Effort: Pulmonary effort is normal. No respiratory distress.     Breath sounds: Normal breath sounds.  Abdominal:     Abdomen is soft. There is no abdominal tenderness. No rebound or guarding. No distention. Musculoskeletal:        General: No swelling. Normal range of motion.     Cervical back: Normal range of motion and neck supple.  Skin:    General: Skin is warm and dry.     Capillary Refill: Capillary refill takes less than 2 seconds.     Findings: Dry flaky skin to bilateral lower extremities, less so to torso.  No involvement of  the palms or soles.  No open wounds. Neurological:     Mental Status: The patient is awake and alert.      ED Results / Procedures / Treatments   Labs (all labs ordered are listed, but only abnormal results are displayed) Labs Reviewed - No data to display   EKG     RADIOLOGY     PROCEDURES:  Critical Care performed:   Procedures   MEDICATIONS ORDERED IN ED: Medications - No data to display   IMPRESSION / MDM / ASSESSMENT AND PLAN / ED COURSE  I reviewed the triage vital signs and the nursing notes.   Differential diagnosis includes, but is not limited to, atopic dermatitis, eczema, contact dermatitis, allergic reaction.  I reviewed the patient's chart.  Patient was seen on 10/26/2022 and started on prednisone and instructed to follow-up with dermatology.  He returned on 11/11/2022 and was treated with topical emollients, famotidine, and triamcinolone.  He was again seen on 11/23/2022 and treated with IV Benadryl and Solu-Medrol, and discharged on prednisone and hydroxyzine.  He was again told to follow-up with dermatology.  Patient is awake and alert, hemodynamically stable and afebrile.  He is  nontoxic in appearance.  He reports that his rash is overall improving, though he is requesting refills of his Atarax and prednisone as these have been helpful for him.  I did recommend that we do further workup today given that this is his fourth visit, including blood work.  However, patient declines.  He reports that he only wants a referral to dermatology only, in addition to the medications to be refilled.  This was done for him.  He declined symptomatic management in the emergency department.  No new meds, no skin sloughing, no bullae, no mucosal involvement to suggest SJS or TENS.  Denies concern for bedbugs or scabies, does not wish to be treated for this.  No shortness of breath or trouble breathing or swallowing, do not suspect anaphylaxis, and timeline not consistent with this.  Bilateral nature of the rash and lack of vesicles not consistent with shingles.  No involvement of palms or soles, not consistent with syphilis or endocarditis.  No fever or constitutional symptoms to suggest staphylococcal scalded skin syndrome or toxic shock, and patient is hemodynamically stable.  No new medications to suggest DRESS.  No tick exposures to suggest [facility] spotted fever, and no petechiae noted.  Rash is blanching.   Medications were refilled for him.  Referral was placed for dermatology.  We discussed return precautions and the importance of close outpatient follow-up.  Patient understands and agrees with plan.  He was discharged in stable condition.   Patient's presentation is most consistent with acute complicated illness / injury requiring diagnostic workup.    FINAL CLINICAL IMPRESSION(S) / ED DIAGNOSES   Final diagnoses:  Rash     Rx / DC Orders   ED Discharge Orders          Ordered    predniSONE (STERAPRED UNI-PAK 21 TAB) 10 MG (21) TBPK tablet        12/07/22 1451    hydrOXYzine (ATARAX) 10 MG tablet  3 times daily PRN        12/07/22 1451    Ambulatory referral to  Dermatology        12/07/22 1452             Note:  This document was prepared using Dragon voice recognition software and may include unintentional dictation errors.  Jackelyn Hoehn, PA-C 12/07/22 1742    Pilar Jarvis, MD 12/08/22 [phone]

## 2022-12-07 NOTE — ED Triage Notes (Signed)
Pt comes with c/o rash all over body for over 3 months. Pt states he needs referral and pain itching has got worse.

## 2022-12-07 NOTE — Discharge Instructions (Signed)
A referral has been placed for dermatology for you.  Your medications were refilled for you.  Please follow-up with your outpatient provider.  Please return for any new, worsening, or change in symptoms or other concerns.  It has been a pleasure caring for you today.

## 2023-01-19 DIAGNOSIS — L509 Urticaria, unspecified: Secondary | ICD-10-CM | POA: Diagnosis not present

## 2023-01-24 DIAGNOSIS — L299 Pruritus, unspecified: Secondary | ICD-10-CM | POA: Diagnosis not present

## 2023-01-24 DIAGNOSIS — R591 Generalized enlarged lymph nodes: Secondary | ICD-10-CM | POA: Diagnosis not present

## 2023-01-24 DIAGNOSIS — R21 Rash and other nonspecific skin eruption: Secondary | ICD-10-CM | POA: Diagnosis not present

## 2023-01-24 LAB — HIV ANTIBODY (ROUTINE TESTING W REFLEX): HIV 1&2 Ab, 4th Generation: NEGATIVE

## 2023-01-24 LAB — HM HEPATITIS C SCREENING LAB: HM Hepatitis Screen: NEGATIVE

## 2023-01-25 DIAGNOSIS — L26 Exfoliative dermatitis: Secondary | ICD-10-CM | POA: Diagnosis not present

## 2023-01-26 DIAGNOSIS — R591 Generalized enlarged lymph nodes: Secondary | ICD-10-CM | POA: Diagnosis not present

## 2023-01-27 DIAGNOSIS — R591 Generalized enlarged lymph nodes: Secondary | ICD-10-CM | POA: Diagnosis not present

## 2023-01-28 DIAGNOSIS — R591 Generalized enlarged lymph nodes: Secondary | ICD-10-CM | POA: Diagnosis not present

## 2023-02-01 DIAGNOSIS — Z79899 Other long term (current) drug therapy: Secondary | ICD-10-CM | POA: Diagnosis not present

## 2023-02-01 DIAGNOSIS — C84A0 Cutaneous T-cell lymphoma, unspecified, unspecified site: Secondary | ICD-10-CM | POA: Diagnosis not present

## 2023-05-03 ENCOUNTER — Ambulatory Visit: Payer: Medicaid Other | Admitting: Dermatology

## 2023-09-09 ENCOUNTER — Telehealth: Payer: Self-pay

## 2023-09-09 NOTE — Telephone Encounter (Signed)
 Copied from CRM [id number]. Topic: Medical Record Request - Other >> Sep 09, 2023  2:10 PM Tiffany S wrote: Reason for CRM: Patient is being treated at cancer facility by Dr Carolann Paralee Cornish seegars request records if needed

## 2023-09-27 ENCOUNTER — Encounter: Payer: Self-pay | Admitting: Internal Medicine

## 2023-09-27 ENCOUNTER — Ambulatory Visit: Payer: Medicaid Other | Admitting: Internal Medicine

## 2023-09-27 VITALS — BP 110/64 | Ht 67.0 in | Wt 110.8 lb

## 2023-09-27 DIAGNOSIS — F32A Depression, unspecified: Secondary | ICD-10-CM

## 2023-09-27 DIAGNOSIS — J410 Simple chronic bronchitis: Secondary | ICD-10-CM | POA: Diagnosis not present

## 2023-09-27 DIAGNOSIS — F1121 Opioid dependence, in remission: Secondary | ICD-10-CM | POA: Diagnosis not present

## 2023-09-27 DIAGNOSIS — E782 Mixed hyperlipidemia: Secondary | ICD-10-CM | POA: Insufficient documentation

## 2023-09-27 DIAGNOSIS — F419 Anxiety disorder, unspecified: Secondary | ICD-10-CM

## 2023-09-27 DIAGNOSIS — G8929 Other chronic pain: Secondary | ICD-10-CM

## 2023-09-27 DIAGNOSIS — I252 Old myocardial infarction: Secondary | ICD-10-CM | POA: Diagnosis not present

## 2023-09-27 DIAGNOSIS — C8409 Mycosis fungoides, extranodal and solid organ sites: Secondary | ICD-10-CM | POA: Diagnosis not present

## 2023-09-27 DIAGNOSIS — R739 Hyperglycemia, unspecified: Secondary | ICD-10-CM | POA: Diagnosis not present

## 2023-09-27 DIAGNOSIS — J449 Chronic obstructive pulmonary disease, unspecified: Secondary | ICD-10-CM | POA: Insufficient documentation

## 2023-09-27 DIAGNOSIS — K219 Gastro-esophageal reflux disease without esophagitis: Secondary | ICD-10-CM | POA: Insufficient documentation

## 2023-09-27 DIAGNOSIS — M545 Low back pain, unspecified: Secondary | ICD-10-CM

## 2023-09-27 LAB — COMPREHENSIVE METABOLIC PANEL WITH GFR
AG Ratio: 1.9 (calc) (ref 1.0–2.5)
ALT: 14 U/L (ref 9–46)
AST: 23 U/L (ref 10–35)
Albumin: 3.7 g/dL (ref 3.6–5.1)
Alkaline phosphatase (APISO): 81 U/L (ref 35–144)
BUN: 24 mg/dL (ref 7–25)
CO2: 29 mmol/L (ref 20–32)
Calcium: 8.2 mg/dL — ABNORMAL LOW (ref 8.6–10.3)
Chloride: 107 mmol/L (ref 98–110)
Creat: 0.83 mg/dL (ref 0.70–1.35)
Globulin: 1.9 g/dL (ref 1.9–3.7)
Glucose, Bld: 99 mg/dL (ref 65–99)
Potassium: 5.6 mmol/L — ABNORMAL HIGH (ref 3.5–5.3)
Sodium: 140 mmol/L (ref 135–146)
Total Bilirubin: 0.2 mg/dL (ref 0.2–1.2)
Total Protein: 5.6 g/dL — ABNORMAL LOW (ref 6.1–8.1)
eGFR: 99 mL/min/1.73m2 (ref >=60)

## 2023-09-27 LAB — CBC
HCT: 34.6 % — ABNORMAL LOW (ref 38.5–50.0)
Hemoglobin: 11.2 g/dL — ABNORMAL LOW (ref 13.2–17.1)
MCH: 28.1 pg (ref 27.0–33.0)
MCHC: 32.4 g/dL (ref 32.0–36.0)
MCV: 86.7 fL (ref 80.0–100.0)
MPV: 11.2 fL (ref 7.5–12.5)
Platelets: 224 Thousand/uL (ref 140–400)
RBC: 3.99 Million/uL — ABNORMAL LOW (ref 4.20–5.80)
RDW: 13.1 % (ref 11.0–15.0)
WBC: 2.7 Thousand/uL — ABNORMAL LOW (ref 3.8–10.8)

## 2023-09-27 LAB — LIPID PANEL
Cholesterol: 98 mg/dL (ref <200)
HDL: 39 mg/dL — ABNORMAL LOW (ref >=40)
LDL Cholesterol (Calc): 44 mg/dL
Non-HDL Cholesterol (Calc): 59 mg/dL (ref <130)
Total CHOL/HDL Ratio: 2.5 (calc) (ref <5.0)
Triglycerides: 70 mg/dL (ref <150)

## 2023-09-27 LAB — HEMOGLOBIN A1C
Hgb A1c MFr Bld: 6.5 % — ABNORMAL HIGH (ref <5.7)
Mean Plasma Glucose: 140 mg/dL
eAG (mmol/L): 7.7 mmol/L

## 2023-09-27 MED ORDER — MIRTAZAPINE 15 MG PO TABS: ORAL_TABLET | ORAL | 0 refills | Status: DC

## 2023-09-27 NOTE — Assessment & Plan Note (Signed)
 CMET and lipid profile today Encouraged him to consume a low fat diet

## 2023-09-27 NOTE — Progress Notes (Signed)
 Subjective:    Patient ID: RAYMUND MANRIQUE, male    DOB: Mar 10, 1960, 63 y.o.   MRN: [id number]  HPI  Patient presents to clinic today to establish care and for management of the conditions listed below.  COPD: He denies current cough or shortness of breath.  He is not using spiriva and rarely uses albuterol only as needed. He does continue to smoke and has not been doing lung cancer screening. There are no PFTs on file.  He does not follow with pulmonology.  Lymphoma: He reports he does injections 1 x week at home. He takes hydroxyzine  as needed for itching. He follows with oncology.  Anxiety and depression: He is not currently taking any medication for this but does feel like he could benefit from some medication for anxiety. He he is not currently seeing a therapist.  He denies anxiety, SI/HI.  HLD s/p MI: There is no recent lipid panel on file.  He does not take any cholesterol-lowering medication or aspirin at this time.  He tries to consume low-fat diet.  History of opioid dependence: Managed with suboxone.  He follows with the suboxone clinic.  Chronic back pain: s/p surgical intervention in 1993. He takes suboxone  as prescribed. MRI lumbar spine from 2016 reviewed.  GERD: Triggered by medication. He no longer takes famotidine . There is no upper GI on file .  Review of Systems   Past Medical History:  Diagnosis Date   COPD (chronic obstructive pulmonary disease) (HCC)    MI, old    Myocardial infarct (HCC)     Current Outpatient Medications  Medication Sig Dispense Refill   albuterol (PROVENTIL HFA;VENTOLIN HFA) 108 (90 BASE) MCG/ACT inhaler Inhale 2 puffs into the lungs every 6 (six) hours as needed for wheezing.     buprenorphine-naloxone (SUBOXONE) 8-2 mg SUBL SL tablet Place 1 tablet under the tongue 2 (two) times daily.     famotidine  (PEPCID ) 20 MG tablet Take 1 tablet (20 mg total) by mouth 2 (two) times daily. 60 tablet 1   hydrOXYzine  (ATARAX ) 10 MG tablet Take 1  tablet (10 mg total) by mouth 3 (three) times daily as needed. 15 tablet 0   hydrOXYzine  (VISTARIL ) 100 MG capsule Take 1 capsule (100 mg total) by mouth 3 (three) times daily as needed for itching. 30 capsule 0   predniSONE  (STERAPRED UNI-PAK 21 TAB) 10 MG (21) TBPK tablet As directed on packaging 1 each 0   tiotropium (SPIRIVA HANDIHALER) 18 MCG inhalation capsule Place 18 mcg into inhaler and inhale daily.     triamcinolone  cream (KENALOG ) 0.1 % Apply 1 Application topically 2 (two) times daily. 30 g 0   No current facility-administered medications for this visit.      No family history on file.  Social History   Socioeconomic History   Marital status: Married    Spouse name: Not on file   Number of children: Not on file   Years of education: Not on file   Highest education level: Not on file  Occupational History   Not on file  Tobacco Use   Smoking status: Every Day    Current packs/day: 1.00    Types: Cigarettes   Smokeless tobacco: Never  Substance and Sexual Activity   Alcohol use: Not Currently   Drug use: Not Currently   Sexual activity: Yes  Other Topics Concern   Not on file  Social History Narrative   Not on file   Social Drivers of Health  Financial Resource Strain: Not on file  Food Insecurity: Not on file  Transportation Needs: Not on file  Physical Activity: Not on file  Stress: Not on file  Social Connections: Not on file  Intimate Partner Violence: Not on file     Constitutional: Denies fever, malaise, fatigue, headache or abrupt weight changes.  HEENT: Denies eye pain, eye redness, ear pain, ringing in the ears, wax buildup, runny nose, nasal congestion, bloody nose, or sore throat. Respiratory: Denies difficulty breathing, shortness of breath, cough or sputum production.   Cardiovascular: Denies chest pain, chest tightness, palpitations or swelling in the hands or feet.  Gastrointestinal: Patient reports intermittent reflux.  Denies abdominal  pain, bloating, constipation, diarrhea or blood in the stool.  GU: Denies urgency, frequency, pain with urination, burning sensation, blood in urine, odor or discharge. Musculoskeletal: Patient reports chronic back pain.  Denies decrease in range of motion, difficulty with gait, muscle pain or joint swelling.  Skin: Denies redness, rashes, lesions or ulcercations.  Neurological: Denies dizziness, difficulty with memory, difficulty with speech or problems with balance and coordination.  Psych: Patient has a history of anxiety and depression.  Denies SI/HI.  No other specific complaints in a complete review of systems (except as listed in HPI above).      Objective:   Physical Exam  BP 110/64 (BP Location: Left Arm, Patient Position: Sitting, Cuff Size: Normal)   Ht 5' 7 (1.702 m)   Wt 110 lb 12.8 oz (50.3 kg)   BMI 17.35 kg/m   Wt Readings from Last 3 Encounters:  11/11/22 130 lb (59 kg)  10/26/22 139 lb 15.9 oz (63.5 kg)  04/12/18 140 lb (63.5 kg)    General: Appears older than his stated age, underweight in NAD. Skin: Red, irritated and extremely dry skin noted HEENT: Head: normal shape and size; Eyes: sclera white, no icterus, conjunctiva pink, PERRLA and EOMs intact;  Cardiovascular: Normal rate and rhythm. S1,S2 noted.  No murmur, rubs or gallops noted. No JVD or BLE edema. No carotid bruits noted. Pulmonary/Chest: Normal effort and positive vesicular breath sounds. No respiratory distress. No wheezes, rales or ronchi noted.  Abdomen: Soft and nontender. Normal bowel sounds.  Musculoskeletal:  No difficulty with gait.  Neurological: Alert and oriented.  Coordination normal.  Psychiatric: Mood and affect normal.  Mildly anxious appearing. Judgment and thought content normal.    BMET    Component Value Date/Time   NA 139 07/28/2015 2128   K 4.2 07/28/2015 2128   CL 107 07/28/2015 2128   CO2 25 07/28/2015 2128   GLUCOSE 114 (H) 07/28/2015 2128   BUN 24 (H) 07/28/2015  2128   CREATININE 1.22 07/28/2015 2128   CALCIUM 9.0 07/28/2015 2128   GFRNONAA >60 07/28/2015 2128   GFRAA >60 07/28/2015 2128    Lipid Panel     Component Value Date/Time   CHOL  11/27/2009 0512    92        ATP III CLASSIFICATION:  <200     mg/dL   Desirable  799-760  mg/dL   Borderline High  >=759    mg/dL   High          TRIG 48 11/27/2009 0512   HDL 52 11/27/2009 0512   CHOLHDL 1.8 11/27/2009 0512   VLDL 10 11/27/2009 0512   LDLCALC  11/27/2009 0512    30        Total Cholesterol/HDL:CHD Risk Coronary Heart Disease Risk Table  Men   Women  1/2 Average Risk   3.4   3.3  Average Risk       5.0   4.4  2 X Average Risk   9.6   7.1  3 X Average Risk  23.4   11.0        Use the calculated Patient Ratio above and the CHD Risk Table to determine the patient's CHD Risk.        ATP III CLASSIFICATION (LDL):  <100     mg/dL   Optimal  899-870  mg/dL   Near or Above                    Optimal  130-159  mg/dL   Borderline  839-810  mg/dL   High  >809     mg/dL   Very High    CBC    Component Value Date/Time   WBC 6.8 07/28/2015 2128   RBC 4.45 07/28/2015 2128   HGB 14.0 07/28/2015 2128   HCT 41.0 07/28/2015 2128   PLT 220 07/28/2015 2128   MCV 92.1 07/28/2015 2128   MCH 31.5 07/28/2015 2128   MCHC 34.2 07/28/2015 2128   RDW 12.3 07/28/2015 2128   LYMPHSABS 2.4 06/19/2010 1750   MONOABS 0.7 06/19/2010 1750   EOSABS 0.1 06/19/2010 1750   BASOSABS 0.1 06/19/2010 1750    Hgb A1C Lab Results  Component Value Date   HGBA1C  11/27/2009    5.6 (NOTE)                                                                       According to the ADA Clinical Practice Recommendations for 2011, when HbA1c is used as a screening test:   >=6.5%   Diagnostic of Diabetes Mellitus           (if abnormal result  is confirmed)  5.7-6.4%   Increased risk of developing Diabetes Mellitus  References:Diagnosis and Classification of Diabetes Mellitus,Diabetes  Care,2011,34(Suppl 1):S62-S69 and Standards of Medical Care in         Diabetes - 2011,Diabetes Care,2011,34  (Suppl 1):S11-S61.            Assessment & Plan:    RTC in 6 months for your annual exam Angeline Laura, NP

## 2023-09-27 NOTE — Assessment & Plan Note (Signed)
 Currently not an issue We will monitor

## 2023-09-27 NOTE — Assessment & Plan Note (Signed)
 Managed with suboxone Will continue follow-up with the suboxone clinic

## 2023-09-27 NOTE — Patient Instructions (Signed)
Mirtazapine Tablets What is this medication? MIRTAZAPINE (mir TAZ a peen) treats depression. It increases the amount of serotonin and norepinephrine in the brain, hormones that help regulate mood. This medicine may be used for other purposes; ask your health care provider or pharmacist if you have questions. COMMON BRAND NAME(S): Remeron What should I tell my care team before I take this medication? They need to know if you have any of these conditions: Bipolar disorder Glaucoma Kidney disease Liver disease Suicidal thoughts An unusual or allergic reaction to mirtazapine, other medications, foods, dyes, or preservatives Pregnant or trying to get pregnant Breast-feeding How should I use this medication? Take this medication by mouth with a glass of water. Follow the directions on the prescription label. Take your medication at regular intervals. Do not take your medication more often than directed. Do not stop taking this medication suddenly except upon the advice of your care team. Stopping this medication too quickly may cause serious side effects or your condition may worsen. A special MedGuide will be given to you by the pharmacist with each prescription and refill. Be sure to read this information carefully each time. Talk to your care team about the use of this medication in children. Special care may be needed. Overdosage: If you think you have taken too much of this medicine contact a poison control center or emergency room at once. NOTE: This medicine is only for you. Do not share this medicine with others. What if I miss a dose? If you miss a dose, take it as soon as you can. If it is almost time for your next dose, take only that dose. Do not take double or extra doses. What may interact with this medication? Do not take this medication with any of the following: Linezolid MAOIs, such as Carbex, Eldepryl, Marplan, Nardil, and Parnate Methylene blue (injected into a vein) This  medication may also interact with the following: Alcohol Antivirals for HIV or AIDS Certain medications that treat or prevent blood clots, such as warfarin Certain medications for fungal infections, such as ketoconazole and itraconazole Certain medications for mental health conditions Certain medications for migraine headache, such as almotriptan, eletriptan, frovatriptan, naratriptan, rizatriptan, sumatriptan, zolmitriptan Certain medications for seizures, such as carbamazepine or phenytoin Certain medications for sleep Cimetidine Erythromycin Fentanyl Lithium Medications for blood pressure Nefazodone Rasagiline Rifampin Supplements, such as St. John's wort, kava kava, valerian Tramadol Tryptophan This list may not describe all possible interactions. Give your health care provider a list of all the medicines, herbs, non-prescription drugs, or dietary supplements you use. Also tell them if you smoke, drink alcohol, or use illegal drugs. Some items may interact with your medicine. What should I watch for while using this medication? Visit your care team for regular checks on your progress. It may be some time before you see the benefit from this medication. This medication may cause thoughts of suicide or depression. This includes sudden changes in mood, behaviors, or thoughts. These changes can happen at any time but are more common in the beginning of treatment or after a change in dose. Call your care team right away if you experience these thoughts or worsening depression. This medication may affect your coordination, reaction time, or judgment. Do not drive or operate machinery until you know how this medication affects you. Sit up or stand slowly to reduce the risk of dizzy or fainting spells. Drinking alcohol with this medication can increase the risk of these side effects. This medication may cause dry  eyes and blurred vision. If you wear contact lenses, you may feel some discomfort.  Lubricating eye drops may help. See your care team if the problem does not go away or is severe. Your mouth may get dry. Chewing sugarless gum or sucking hard candy and drinking plenty of water may help. Contact your care team if the problem does not go away or is severe. What side effects may I notice from receiving this medication? Side effects that you should report to your care team as soon as possible: Allergic reactions--skin rash, itching, hives, swelling of the face, lips, tongue, or throat Heart rhythm changes--fast or irregular heartbeat, dizziness, feeling faint or lightheaded, chest pain, trouble breathing Infection--fever, chills, cough, or sore throat Irritability, confusion, fast or irregular heartbeat, muscle stiffness, twitching muscles, sweating, high fever, seizure, chills, vomiting, diarrhea, which may be signs of serotonin syndrome Low sodium level--muscle weakness, fatigue, dizziness, headache, confusion Rash, fever, and swollen lymph nodes Redness, blistering, peeling or loosening of the skin, including inside the mouth Seizures Sudden eye pain or change in vision such as blurry vision, seeing halos around lights, vision loss Thoughts of suicide or self-harm, worsening mood, feelings of depression Side effects that usually do not require medical attention (report to your care team if they continue or are bothersome): Constipation Dizziness Drowsiness Dry mouth Increase in appetite Weight gain This list may not describe all possible side effects. Call your doctor for medical advice about side effects. You may report side effects to FDA at 1-800-FDA-1088. Where should I keep my medication? Keep out of the reach of children. Store at room temperature between 15 and 30 degrees C (59 and 86 degrees F) Protect from light and moisture. Throw away any unused medication after the expiration date. NOTE: This sheet is a summary. It may not cover all possible information. If you  have questions about this medicine, talk to your doctor, pharmacist, or health care provider.  2024 Elsevier/Gold Standard (2021-10-01 00:00:00)

## 2023-09-27 NOTE — Assessment & Plan Note (Signed)
 He will continue to get suboxone from a suboxone clinic

## 2023-09-27 NOTE — Assessment & Plan Note (Signed)
 Currently receiving treatment with oncology

## 2023-09-27 NOTE — Assessment & Plan Note (Signed)
 He is not interested in daily maintenance therapy at this time He continues to use albuterol as needed

## 2023-09-27 NOTE — Assessment & Plan Note (Signed)
 Will try mirtazapine  15 mg given his poor appetite, underweight status, anxiety and depression with sleep issues We will monitor

## 2023-09-28 ENCOUNTER — Ambulatory Visit: Payer: Self-pay | Admitting: Internal Medicine

## 2023-09-30 ENCOUNTER — Ambulatory Visit: Admitting: Internal Medicine

## 2023-09-30 ENCOUNTER — Encounter: Payer: Self-pay | Admitting: Internal Medicine

## 2023-09-30 VITALS — BP 128/82 | Ht 67.0 in | Wt 110.0 lb

## 2023-09-30 DIAGNOSIS — E875 Hyperkalemia: Secondary | ICD-10-CM

## 2023-09-30 DIAGNOSIS — E099 Drug or chemical induced diabetes mellitus without complications: Secondary | ICD-10-CM | POA: Diagnosis not present

## 2023-09-30 DIAGNOSIS — Z23 Encounter for immunization: Secondary | ICD-10-CM | POA: Diagnosis not present

## 2023-09-30 DIAGNOSIS — C8409 Mycosis fungoides, extranodal and solid organ sites: Secondary | ICD-10-CM

## 2023-09-30 DIAGNOSIS — D630 Anemia in neoplastic disease: Secondary | ICD-10-CM

## 2023-09-30 NOTE — Progress Notes (Signed)
 Subjective:    Patient ID: Thomas Dodson, male    DOB: 02-Dec-1960, 63 y.o.   MRN: [id number]  HPI  Patient presents to clinic today for follow-up of labs.  Steroid induced diabetes: New onset.  His recent A1c was 6.5%.  He has never been told that he has diabetes in the past.  He does not check his sugars.  He is undergoing treatment for lymphoma and admits that he has had multiple steroid injections and rounds of prednisone  within the last year.  Of note, his potassium was 5.6.  He reports he does eat some bananas.  He is not on any medications that he is aware that can cause elevated potassium.  He denies any chest pain or chest tightness.  He was also shown to be anemic with an H&H of 11.4/34.6.  He is undergoing treatment for lymphoma at this time.  He is not taking any oral iron at this time.  He does follow with oncology.  Review of Systems   Past Medical History:  Diagnosis Date   Anxiety    Cancer (HCC)    Lymphoma   COPD (chronic obstructive pulmonary disease) (HCC)    MI, old    Myocardial infarct (HCC)     Current Outpatient Medications  Medication Sig Dispense Refill   albuterol (PROVENTIL HFA;VENTOLIN HFA) 108 (90 BASE) MCG/ACT inhaler Inhale 2 puffs into the lungs every 6 (six) hours as needed for wheezing.     buprenorphine-naloxone (SUBOXONE) 8-2 mg SUBL SL tablet Place 1 tablet under the tongue 2 (two) times daily.     cetirizine (ZYRTEC) 10 MG tablet Take 10 mg by mouth daily.     hydrOXYzine  (ATARAX ) 25 MG tablet Take 25 mg by mouth every 6 (six) hours as needed for itching.     mirtazapine  (REMERON ) 15 MG tablet Take 0.5 tablets (7.5 mg total) by mouth at bedtime for 10 days, THEN 1 tablet (15 mg total) at bedtime. 85 tablet 0   Peginterferon alfa-2a 180 MCG/0.5ML SOSY Inject 180 mcg into the skin once a week.     triamcinolone  cream (KENALOG ) 0.1 % Apply 1 Application topically 2 (two) times daily. 30 g 0   No current facility-administered medications for  this visit.      No family history on file.  Social History   Socioeconomic History   Marital status: Married    Spouse name: Not on file   Number of children: Not on file   Years of education: Not on file   Highest education level: High school graduate  Occupational History   Not on file  Tobacco Use   Smoking status: Former    Average packs/day: 1 pack/day for 9.0 years (9.0 ttl pk-yrs)    Types: Cigarettes    Start date: 2015   Smokeless tobacco: Never  Vaping Use   Vaping status: Every Day  Substance and Sexual Activity   Alcohol use: Not Currently   Drug use: Not Currently   Sexual activity: Yes  Other Topics Concern   Not on file  Social History Narrative   Not on file   Social Drivers of Health   Financial Resource Strain: Not on file  Food Insecurity: Not on file  Transportation Needs: Not on file  Physical Activity: Not on file  Stress: Not on file  Social Connections: Not on file  Intimate Partner Violence: Not on file     Constitutional: Denies fever, malaise, fatigue, headache or abrupt weight changes.  HEENT: Denies eye pain, eye redness, ear pain, ringing in the ears, wax buildup, runny nose, nasal congestion, bloody nose, or sore throat. Respiratory: Denies difficulty breathing, shortness of breath, cough or sputum production.   Cardiovascular: Denies chest pain, chest tightness, palpitations or swelling in the hands or feet.  Gastrointestinal: Patient reports intermittent reflux.  Denies abdominal pain, bloating, constipation, diarrhea or blood in the stool.  GU: Denies urgency, frequency, pain with urination, burning sensation, blood in urine, odor or discharge. Musculoskeletal: Patient reports chronic back pain.  Denies decrease in range of motion, difficulty with gait, muscle pain or joint swelling.  Skin: Denies redness, rashes, lesions or ulcercations.  Neurological: Denies dizziness, difficulty with memory, difficulty with speech or problems  with balance and coordination.  Psych: Patient has a history of anxiety and depression.  Denies SI/HI.  No other specific complaints in a complete review of systems (except as listed in HPI above).      Objective:   Physical Exam BP 128/82 (BP Location: Right Arm, Patient Position: Sitting, Cuff Size: Normal)   Ht 5' 7 (1.702 m)   Wt 110 lb (49.9 kg)   BMI 17.23 kg/m    Wt Readings from Last 3 Encounters:  09/27/23 110 lb 12.8 oz (50.3 kg)  11/11/22 130 lb (59 kg)  10/26/22 139 lb 15.9 oz (63.5 kg)    General: Appears older than his stated age, underweight in NAD. Skin: Red, irritated and extremely dry skin noted HEENT: Head: normal shape and size; Eyes: sclera white, no icterus, conjunctiva pink, PERRLA and EOMs intact;  Cardiovascular: Normal rate and rhythm. S1,S2 noted.  No murmur, rubs or gallops noted. No JVD or BLE edema. No carotid bruits noted. Pulmonary/Chest: Normal effort and positive vesicular breath sounds. No respiratory distress. No wheezes, rales or ronchi noted.  Abdomen: Soft and nontender. Normal bowel sounds.  Musculoskeletal:  No difficulty with gait.  Neurological: Alert and oriented.  Coordination normal.  Psychiatric: Mood and affect normal.  Mildly anxious appearing. Judgment and thought content normal.    BMET    Component Value Date/Time   NA 140 09/27/2023 1029   K 5.6 (H) 09/27/2023 1029   CL 107 09/27/2023 1029   CO2 29 09/27/2023 1029   GLUCOSE 99 09/27/2023 1029   BUN 24 09/27/2023 1029   CREATININE 0.83 09/27/2023 1029   CALCIUM 8.2 (L) 09/27/2023 1029   GFRNONAA >60 07/28/2015 2128   GFRAA >60 07/28/2015 2128    Lipid Panel     Component Value Date/Time   CHOL 98 09/27/2023 1029   TRIG 70 09/27/2023 1029   HDL 39 (L) 09/27/2023 1029   CHOLHDL 2.5 09/27/2023 1029   VLDL 10 11/27/2009 0512   LDLCALC 44 09/27/2023 1029    CBC    Component Value Date/Time   WBC 2.7 (L) 09/27/2023 1029   RBC 3.99 (L) 09/27/2023 1029   HGB  11.2 (L) 09/27/2023 1029   HCT 34.6 (L) 09/27/2023 1029   PLT 224 09/27/2023 1029   MCV 86.7 09/27/2023 1029   MCH 28.1 09/27/2023 1029   MCHC 32.4 09/27/2023 1029   RDW 13.1 09/27/2023 1029   LYMPHSABS 2.4 06/19/2010 1750   MONOABS 0.7 06/19/2010 1750   EOSABS 0.1 06/19/2010 1750   BASOSABS 0.1 06/19/2010 1750    Hgb A1C Lab Results  Component Value Date   HGBA1C 6.5 (H) 09/27/2023            Assessment & Plan:   Assessment and Plan  Lymphoma (under active treatment) Lymphoma under active treatment with Pegasys. Steroid use may be necessary intermittently. Discussed potential for diabetes reversal if steroid use is reduced or stopped. - Discuss steroid use and diabetes management with oncologist.  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 A1c is 6.5. No need for blood sugar monitoring or medication. Emphasized informing oncologist about diagnosis. Discussed potential complications of uncontrolled diabetes. Advised dietary modifications. - Inform oncologist about steroid-induced diabetes mellitus diagnosis. - Advise dietary modifications to reduce sugar and carbohydrate intake. - Schedule annual eye exam and inform eye doctor about diabetes for special exam. 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 Oncologist monitoring with monthly labs.  Hyperkalemia Mild hyperkalemia with potassium level of 5.6. Discussed potential risks including cardiac arrest. - Advise reduction of potassium-rich foods, such as bananas.        RTC in 6 months for your annual exam Angeline Laura, NP

## 2023-09-30 NOTE — Patient Instructions (Signed)
 Diabetes: Types of Medical Care to Help You Manage Living with and managing diabetes, also known as diabetes mellitus, can be a challenge. Your diabetes care may involve a team of health care providers to help you manage. This team may include: An endocrinologist. This is a physician who specializes in diabetes. Nurses. An expert in healthy eating called a dietitian. A diabetes care and education specialist. An eye doctor. A foot specialist called a podiatrist. How to manage your diabetes Managing your diabetes involves a lot of steps to keep you healthy. Your team will follow guidelines to help you get the best care possible. Here are some general guidelines for managing your diabetes: Physical exams When you're first diagnosed with diabetes, and each year after that, your provider will ask about your medical and family history. You'll also have a physical exam. It may include: Measuring your height, weight, and body mass index (BMI). Checking your blood pressure. Your target blood pressure may vary based on things like your age and health conditions. A thyroid exam. A skin exam. Screening for nerve damage. This may include checking your hands, feet, and arms for: Pain. Tingling or numbness. Weakness. An exam to check your feet. They'll be checked for cuts, bruises, redness, blisters, sores, or other problems. Screening for blood vessel problems. This may include checking the pulse and temperature in your legs and feet. Blood tests Depending on your treatment plan, you may have these tests: Hemoglobin A1C (HbA1C). This test gives information about your blood sugar levels, also called glucose levels, over the past 2-3 months. It's used to adjust your treatment plan, if needed. Lipid testing. This includes total cholesterol, LDL and HDL cholesterol, and triglyceride levels. The goal for LDL is less than 100 mg/dL (5.5 mmol/L). If you're at high risk for problems, the goal is less than 70  mg/dL (3.9 mmol/L). The goal for HDL is 40 mg/dL (2.2 mmol/L) or higher for males, and 50 mg/dL (2.8 mmol/L) or higher for females. The goal for triglycerides is less than 150 mg/dL (8.3 mmol/L). Liver function tests. Kidney function tests. Thyroid function tests.  Dental and eye exams  Visit your dentist two times a year for checkups. Get eye exams as recommended by your provider. This may include: If you have type 1 diabetes, get an eye exam within 5 years after you're diagnosed. Then get exams once a year after your first exam. Children with type 1 diabetes should get an eye exam when they're 68 years old or older and they've had diabetes for 3-5 years. After the first exam, they should get an eye exam every 2 years. If you have type 2 diabetes, get an eye exam as soon as you're diagnosed. Then get one every 1-2 years after your first exam. Shots or vaccines Get shots or vaccines as told. Guidelines include: Everyone aged 26 months and older should get a flu shot every year. People at least 62 years old who have diabetes should get the pneumonia vaccine. All adults who get diagnosed with diabetes should get the hepatitis B vaccine. For all other vaccines, follow the recommendations from the Centers for Disease Control and Prevention (CDC) based on your age. Mental and emotional health Screening for eating disorders, anxiety, and depression is suggested at the time of diagnosis and then as needed. If you show symptoms, you may need more evaluation. You may need to work with a mental health provider. Follow these instructions at home: Treatment plan You'll monitor your blood sugar levels  and may give yourself insulin. Your treatment plan will be reviewed at every medical visit. You and your provider will discuss: How you're taking your medicines, including insulin. Any side effects you have. Your target goals for your blood sugar level. How often you check your blood sugar  level. Lifestyle habits, such as: Your activity level. Any use of tobacco, alcohol, or other substances. Education Your provider will assess how well you manage your blood sugar levels and medicines. You may be referred to: A certified diabetes care and education specialist. This person can help you manage your diabetes throughout your life. A dietitian who can help with your eating plan. An exercise specialist who can discuss your activity level and exercise plan. General instructions Take medicines only as told. Where to find more information American Diabetes Association (ADA): diabetes.org Association of Diabetes Care & Education Specialists (ADCES): adces.org/diabetes-education-dsmes International Diabetes Federation (IDF): http://hill.biz/ This information is not intended to replace advice given to you by your health care provider. Make sure you discuss any questions you have with your health care provider. Document Revised: 09/11/2022 Document Reviewed: 09/11/2022 Elsevier Patient Education  2024 [employer].

## 2023-10-25 DIAGNOSIS — C8400 Mycosis fungoides, unspecified site: Secondary | ICD-10-CM | POA: Diagnosis not present

## 2023-12-20 DIAGNOSIS — C8400 Mycosis fungoides, unspecified site: Secondary | ICD-10-CM | POA: Diagnosis not present

## 2023-12-24 ENCOUNTER — Other Ambulatory Visit: Payer: Self-pay | Admitting: Internal Medicine

## 2023-12-25 NOTE — Telephone Encounter (Signed)
 Requested medication (s) are due for refill today: yes  Requested medication (s) are on the active medication list: yes  Last refill:  09/27/23  Future visit scheduled: {Yes  Notes to clinic:  routing for review     Requested Prescriptions  Pending Prescriptions Disp Refills   mirtazapine  (REMERON ) 15 MG tablet [Pharmacy Med Name: MIRTAZAPINE  15MG  TABLETS] 85 tablet 0    Sig: TAKE 1/2 TABLET(7.5 MG) BY MOUTH AT BEDTIME FOR 10 DAYS THEN TAKE 1 TABLET(15 MG) BY MOUTH AT BEDTIME     Psychiatry: Antidepressants - mirtazapine  Passed - 12/25/2023 10:54 AM      Passed - Completed PHQ-2 or PHQ-9 in the last 360 days      Passed - Valid encounter within last 6 months    Recent Outpatient Visits           2 months ago Steroid-induced diabetes mellitus, initial encounter   [facility] [place], Angeline ORN, NP   2 months ago Mixed hyperlipidemia   [facility] [place], Angeline ORN, [place]

## 2024-01-17 DIAGNOSIS — C8400 Mycosis fungoides, unspecified site: Secondary | ICD-10-CM | POA: Diagnosis not present

## 2024-02-25 ENCOUNTER — Telehealth: Payer: Self-pay

## 2024-02-25 DIAGNOSIS — C8404 Mycosis fungoides, lymph nodes of axilla and upper limb: Secondary | ICD-10-CM

## 2024-02-25 NOTE — Progress Notes (Unsigned)
 Complex Care Management Note Care Guide Note  02/25/2024 Name: Thomas Dodson MRN: [id number] DOB: 02-03-1961   Complex Care Management Outreach Attempts: A second unsuccessful outreach was attempted today to offer the patient with information about available complex care management services.  Follow Up Plan:  Additional outreach attempts will be made to offer the patient complex care management information and services.   Encounter Outcome:  No Answer  Dreama [facility]  [facility], [facility] VBCI Assistant Direct Dial: [phone]  Fax: [phone]

## 2024-02-28 NOTE — Progress Notes (Unsigned)
 Complex Care Management Note Care Guide Note  02/28/2024 Name: Thomas Dodson MRN: [id number] DOB: 01/14/1961   Complex Care Management Outreach Attempts: A second unsuccessful outreach was attempted today to offer the patient with information about available complex care management services.  Follow Up Plan:  Additional outreach attempts will be made to offer the patient complex care management information and services.   Encounter Outcome:  No Answer  Dreama [facility]  [facility], [facility] VBCI Assistant Direct Dial: [phone]  Fax: [phone]

## 2024-03-02 NOTE — Progress Notes (Signed)
 Complex Care Management Note Care Guide Note  03/02/2024 Name: Thomas Dodson MRN: [id number] DOB: Jul 08, 1960   Complex Care Management Outreach Attempts: A third unsuccessful outreach was attempted today to offer the patient with information about available complex care management services.  Follow Up Plan:  No further outreach attempts will be made at this time. We have been unable to contact the patient to offer or enroll patient in complex care management services.  Encounter Outcome:  No Answer  Dreama [facility]  [facility], [facility] VBCI Assistant Direct Dial: [phone]  Fax: [phone]

## 2024-03-21 ENCOUNTER — Encounter: Admitting: Internal Medicine
